# Patient Record
Sex: Female | Born: 1985 | Race: Black or African American | Hispanic: No | Marital: Single | Smoking: Former smoker
Health system: Southern US, Community
[De-identification: ages and names within clinical notes are randomized; demographics above are authoritative.]

## PROBLEM LIST (undated history)

## (undated) ENCOUNTER — Inpatient Hospital Stay (HOSPITAL_COMMUNITY): Payer: Self-pay

## (undated) DIAGNOSIS — M419 Scoliosis, unspecified: Secondary | ICD-10-CM

## (undated) HISTORY — PX: WISDOM TOOTH EXTRACTION: SHX21

## (undated) HISTORY — DX: Scoliosis, unspecified: M41.9

## (undated) HISTORY — PX: OTHER SURGICAL HISTORY: SHX169

---

## 2007-03-07 ENCOUNTER — Emergency Department (HOSPITAL_COMMUNITY): Admission: EM | Admit: 2007-03-07 | Discharge: 2007-03-07 | Payer: Self-pay | Admitting: Emergency Medicine

## 2009-03-26 ENCOUNTER — Ambulatory Visit (HOSPITAL_COMMUNITY): Admission: RE | Admit: 2009-03-26 | Discharge: 2009-03-26 | Payer: Self-pay | Admitting: Obstetrics

## 2009-04-19 ENCOUNTER — Ambulatory Visit (HOSPITAL_COMMUNITY): Admission: RE | Admit: 2009-04-19 | Discharge: 2009-04-19 | Payer: Self-pay | Admitting: Obstetrics & Gynecology

## 2009-08-20 ENCOUNTER — Ambulatory Visit (HOSPITAL_COMMUNITY): Admission: RE | Admit: 2009-08-20 | Discharge: 2009-08-20 | Payer: Self-pay | Admitting: Obstetrics & Gynecology

## 2009-08-31 ENCOUNTER — Inpatient Hospital Stay (HOSPITAL_COMMUNITY): Admission: AD | Admit: 2009-08-31 | Discharge: 2009-09-04 | Payer: Self-pay | Admitting: Obstetrics & Gynecology

## 2010-04-27 LAB — URINALYSIS, DIPSTICK ONLY
Leukocytes, UA: NEGATIVE
Nitrite: NEGATIVE
Protein, ur: NEGATIVE mg/dL
Specific Gravity, Urine: 1.015 (ref 1.005–1.030)
Urobilinogen, UA: 0.2 mg/dL (ref 0.0–1.0)

## 2010-04-27 LAB — CBC
HCT: 34.2 % — ABNORMAL LOW (ref 36.0–46.0)
HCT: 36.5 % (ref 36.0–46.0)
HCT: 37.3 % (ref 36.0–46.0)
Hemoglobin: 12.3 g/dL (ref 12.0–15.0)
MCH: 29.3 pg (ref 26.0–34.0)
MCH: 29.5 pg (ref 26.0–34.0)
MCH: 29.6 pg (ref 26.0–34.0)
MCHC: 33.1 g/dL (ref 30.0–36.0)
MCV: 89 fL (ref 78.0–100.0)
MCV: 89.2 fL (ref 78.0–100.0)
Platelets: 215 10*3/uL (ref 150–400)
Platelets: 227 10*3/uL (ref 150–400)
RBC: 4.09 MIL/uL (ref 3.87–5.11)
RDW: 14.3 % (ref 11.5–15.5)
RDW: 14.4 % (ref 11.5–15.5)
RDW: 14.5 % (ref 11.5–15.5)
WBC: 11 10*3/uL — ABNORMAL HIGH (ref 4.0–10.5)
WBC: 21.1 10*3/uL — ABNORMAL HIGH (ref 4.0–10.5)
WBC: 8.6 10*3/uL (ref 4.0–10.5)

## 2010-04-27 LAB — RPR: RPR Ser Ql: NONREACTIVE

## 2010-04-27 LAB — COMPREHENSIVE METABOLIC PANEL
ALT: 31 U/L (ref 0–35)
Albumin: 3 g/dL — ABNORMAL LOW (ref 3.5–5.2)
Alkaline Phosphatase: 185 U/L — ABNORMAL HIGH (ref 39–117)
GFR calc Af Amer: >60 mL/min (ref >60)
Potassium: 4 mEq/L (ref 3.5–5.1)
Total Bilirubin: 0.3 mg/dL (ref 0.3–1.2)
Total Protein: 6.6 g/dL (ref 6.0–8.3)

## 2010-08-27 IMAGING — US US OB DETAIL+14 WK
1 of 2 series · 14 of 28 positions shown · non-contrast
Comparison: none

OBSTETRICAL ULTRASOUND:
 This ultrasound exam was performed in the [HOSPITAL] Ultrasound Department.  The OB US report was generated in the AS system, and faxed to the ordering physician.  This report is also available in [HOSPITAL]?s AccessANYware and in [REDACTED] PACS.

[Series 1: us ob detail +14 wk · 0.30mm/px · 50 acquisitions, 14 frames shown]
[im 1/50]
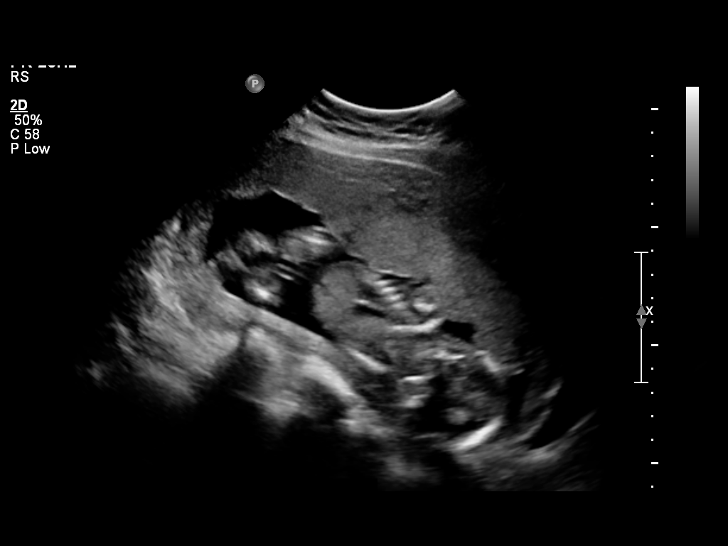
[im 4/50]
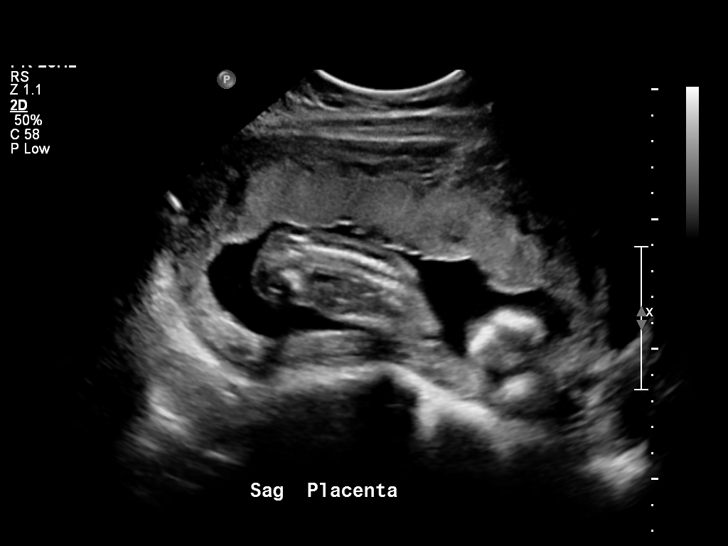
[im 8/50]
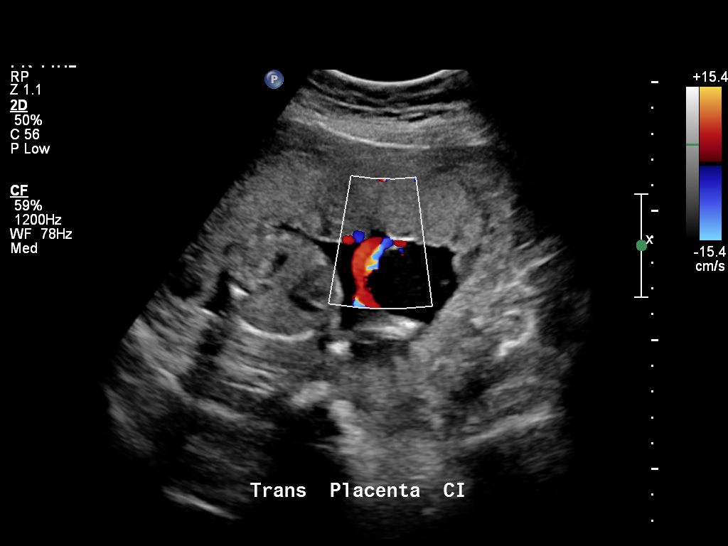
[im 12/50]
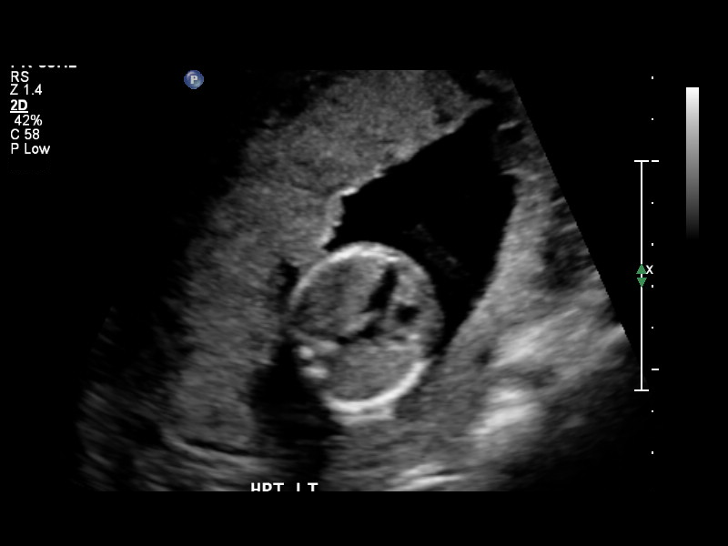
[im 16/50]
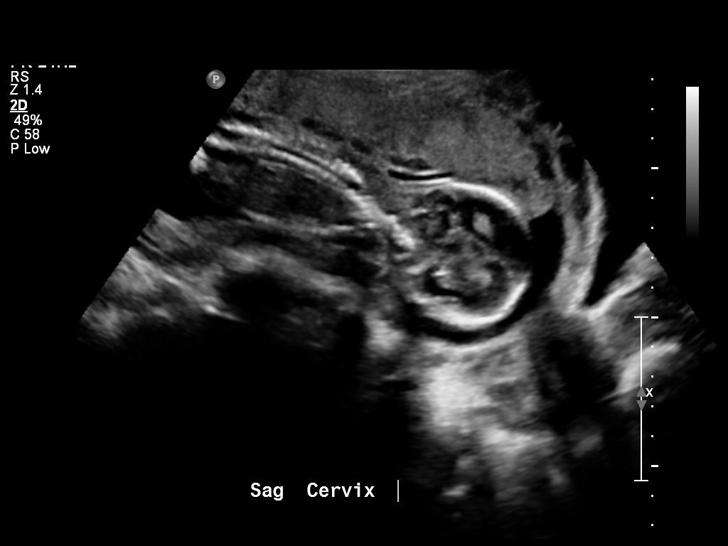
[im 19/50]
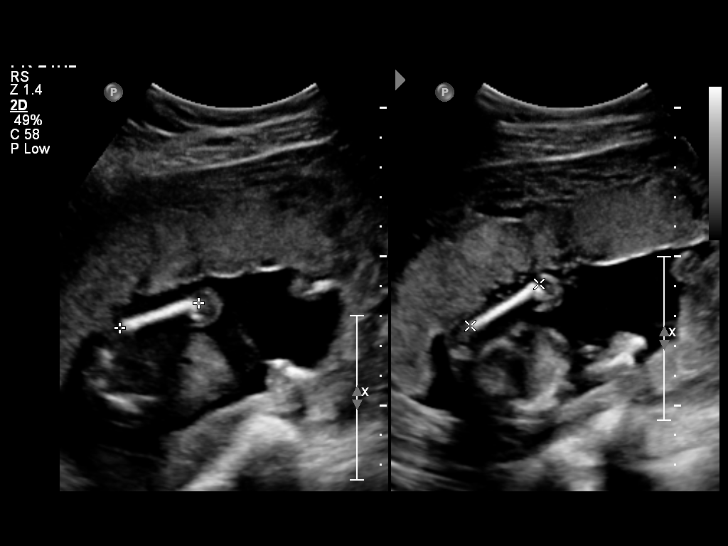
[im 23/50]
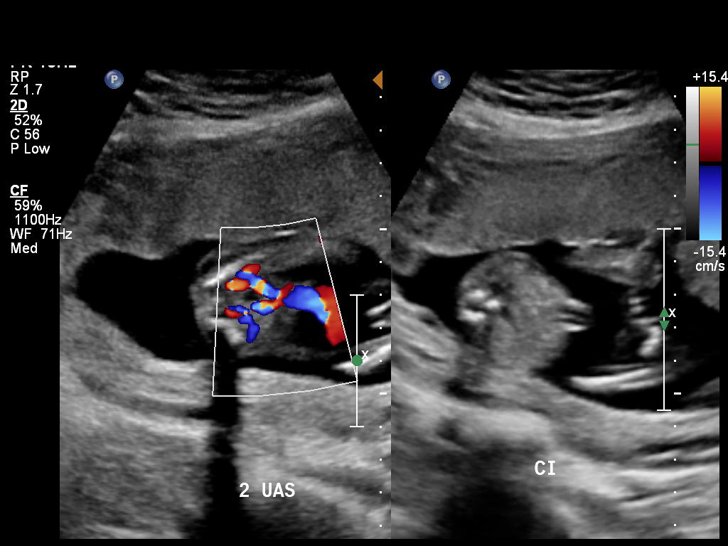
[im 27/50]
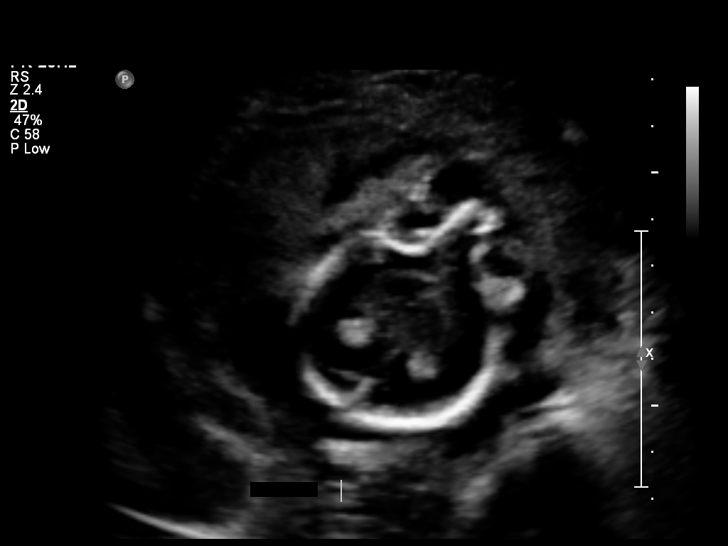
[im 31/50]
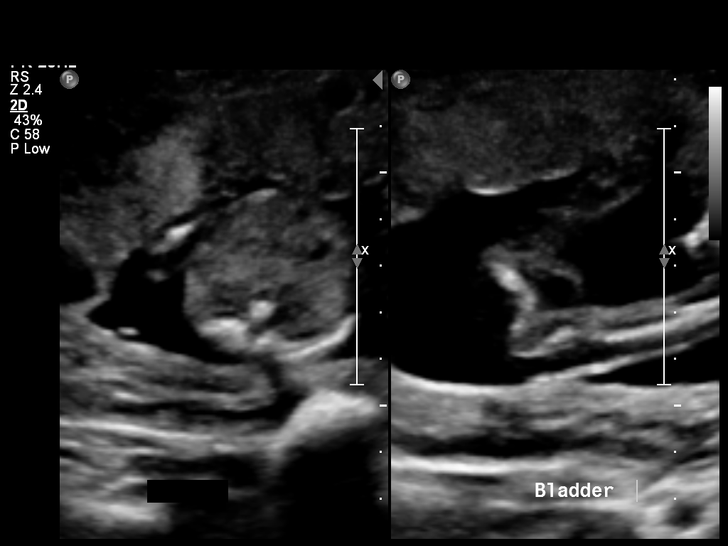
[im 34/50]
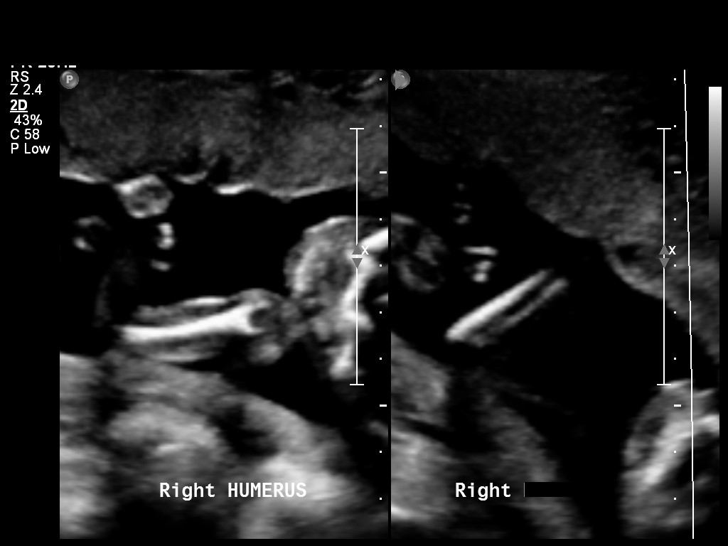
[im 38/50]
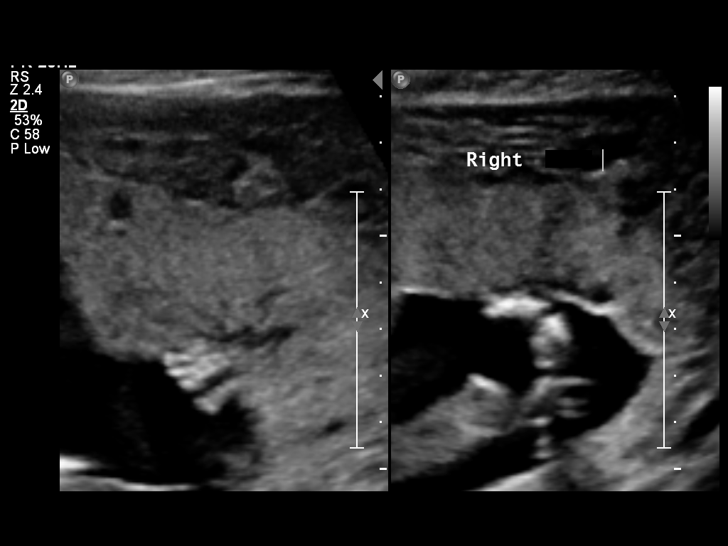
[im 42/50]
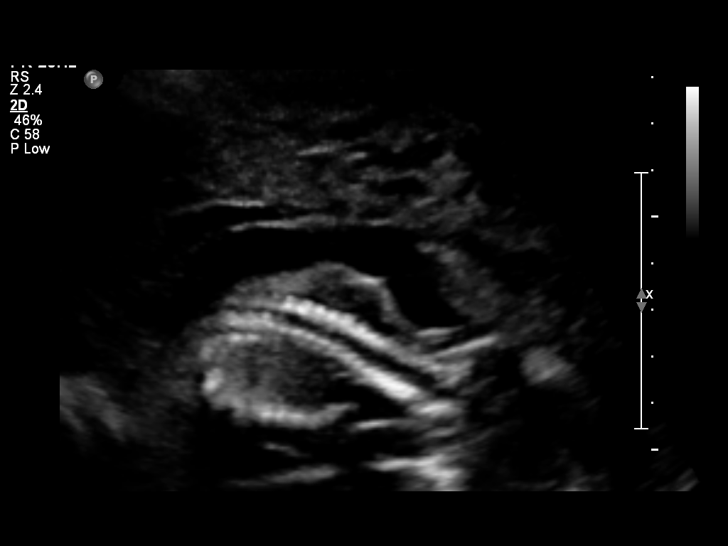
[im 46/50]
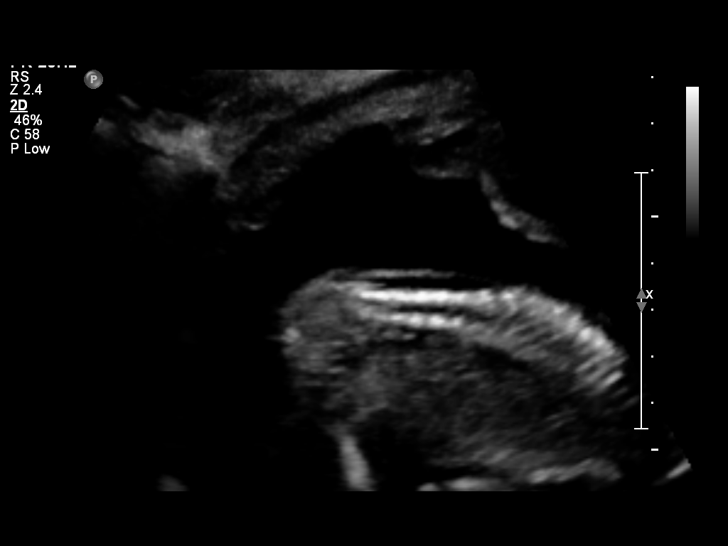
[im 50/50]
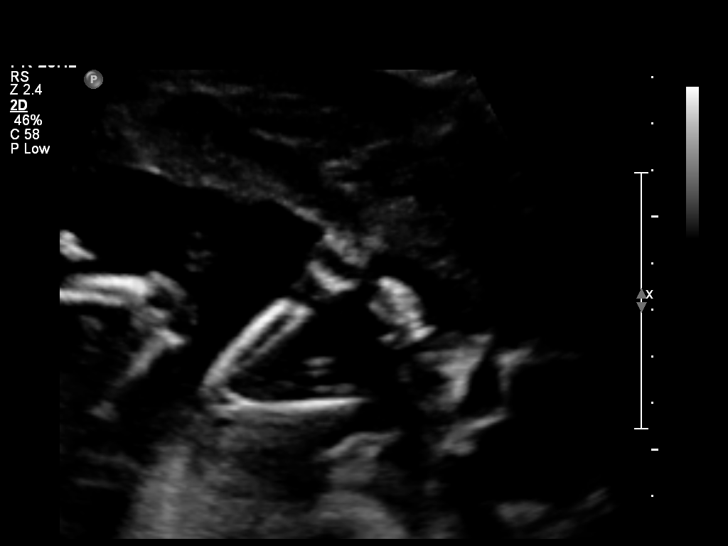

[14 of 28 positions shown; findings below may reference images not displayed]

IMPRESSION: See AS Obstetric US report.

## 2012-06-09 ENCOUNTER — Ambulatory Visit (INDEPENDENT_AMBULATORY_CARE_PROVIDER_SITE_OTHER): Payer: Medicaid Other | Admitting: Obstetrics

## 2012-06-09 ENCOUNTER — Encounter: Payer: Self-pay | Admitting: Obstetrics

## 2012-06-09 VITALS — BP 130/93 | HR 75 | Temp 97.8°F | Wt 166.0 lb

## 2012-06-09 DIAGNOSIS — Z113 Encounter for screening for infections with a predominantly sexual mode of transmission: Secondary | ICD-10-CM

## 2012-06-09 DIAGNOSIS — N760 Acute vaginitis: Secondary | ICD-10-CM | POA: Insufficient documentation

## 2012-06-09 MED ORDER — TINIDAZOLE 500 MG PO TABS: 1000.0000 mg | ORAL_TABLET | Freq: Every day | ORAL | Status: DC

## 2012-06-09 NOTE — Progress Notes (Signed)
Subjective:     Veronica Hall is a 27 y.o. female here for problem visit.  Current complaints: yellow vaginal discharge with odor x 3 weeks.  Personal health questionnaire reviewed: yes.   Gynecologic History Patient's last menstrual period was 05/24/2012. Contraception: OCP (estrogen/progesterone)  The following portions of the patient's history were reviewed and updated as appropriate: allergies, current medications, past family history, past medical history, past social history, past surgical history and problem list.  Review of Systems Pertinent items are noted in HPI.    Objective:    Pelvic: cervix normal in appearance, external genitalia normal and Vagina with gray, thin discharge    Assessment:    BV  Plan:    Education reviewed: safe sex/STD prevention.   Tindamax Rx

## 2012-06-10 ENCOUNTER — Encounter: Payer: Self-pay | Admitting: Obstetrics

## 2012-06-10 LAB — WET PREP BY MOLECULAR PROBE
Candida species: NEGATIVE
Gardnerella vaginalis: POSITIVE — AB
Trichomonas vaginosis: NEGATIVE

## 2012-06-10 NOTE — Patient Instructions (Signed)
+  BV

## 2012-07-22 ENCOUNTER — Encounter: Payer: Self-pay | Admitting: Obstetrics & Gynecology

## 2012-07-26 ENCOUNTER — Encounter: Payer: Self-pay | Admitting: Obstetrics

## 2012-07-26 ENCOUNTER — Ambulatory Visit: Payer: Self-pay | Admitting: Obstetrics & Gynecology

## 2012-10-15 ENCOUNTER — Encounter (HOSPITAL_COMMUNITY): Payer: Self-pay

## 2012-10-15 ENCOUNTER — Inpatient Hospital Stay (HOSPITAL_COMMUNITY): Payer: Medicaid Other

## 2012-10-15 ENCOUNTER — Inpatient Hospital Stay (HOSPITAL_COMMUNITY)
Admission: AD | Admit: 2012-10-15 | Discharge: 2012-10-15 | Disposition: A | Discharge status: Home / Self Care | Payer: Medicaid Other | Source: Ambulatory Visit | Attending: Obstetrics & Gynecology | Admitting: Obstetrics & Gynecology

## 2012-10-15 DIAGNOSIS — O4690 Antepartum hemorrhage, unspecified, unspecified trimester: Secondary | ICD-10-CM

## 2012-10-15 DIAGNOSIS — O071 Delayed or excessive hemorrhage following failed attempted termination of pregnancy: Secondary | ICD-10-CM | POA: Insufficient documentation

## 2012-10-15 LAB — CBC
HCT: 38.4 % (ref 36.0–46.0)
Hemoglobin: 12.6 g/dL (ref 12.0–15.0)
MCV: 86.1 fL (ref 78.0–100.0)
RDW: 13.7 % (ref 11.5–15.5)

## 2012-10-15 LAB — ABO/RH: ABO/RH(D): A POS

## 2012-10-15 NOTE — MAU Provider Note (Signed)
History     CSN: [id number]  Arrival date and time: 10/15/12 1007   None     Chief Complaint  Patient presents with  . Vaginal Bleeding   HPI Comments: Veronica Hall 27 y.o. [id number] presents to MAU for vaginal bleeding that is minimal. She is pregnant with unknown GA. She had TAB in July and did not keep her follow up appointment with clinic. She started to attempt pregnancy after one month  From TAB. She is a patient of Dr Tamela Oddi.     Patient is a 27 y.o. female presenting with vaginal bleeding.  Vaginal Bleeding      Past Medical History  Diagnosis Date  . Scoliosis     Past Surgical History  Procedure Laterality Date  . Spinal infusion    . Wisdom tooth extraction      Family History  Problem Relation Age of Onset  . Hypertension    . Hyperlipidemia    . Parkinson's disease Maternal Grandmother   . Alzheimer's disease Maternal Grandmother   . Hyperlipidemia Maternal Grandfather     History  Substance Use Topics  . Smoking status: Former Smoker    Types: Cigarettes    Quit date: 06/09/2008  . Smokeless tobacco: Never Used  . Alcohol Use: Yes     Comment: "socially"    Allergies: No Known Allergies  Prescriptions prior to admission  Medication Sig Dispense Refill  . naproxen sodium (ANAPROX) 220 MG tablet Take 220 mg by mouth 2 (two) times daily with a meal.      . penicillin v potassium (VEETID) 500 MG tablet Take 500 mg by mouth 4 (four) times daily.        Review of Systems  Constitutional: Negative.   HENT: Negative.   Respiratory: Negative.   Cardiovascular: Negative.   Gastrointestinal: Negative.   Genitourinary: Negative.        Bleeding vaginally  Musculoskeletal: Negative.   Skin: Negative.   Neurological: Negative.   Psychiatric/Behavioral: Negative.        Tearful   Physical Exam   Blood pressure 147/96, pulse 86, temperature 98.2 F (36.8 C), temperature source Oral, resp. rate 16, height 5\' 4"  (1.626 m), weight 166  lb 12.8 oz (75.66 kg), last menstrual period 07/19/2012, SpO2 100.00%.  Physical Exam  Constitutional: She is oriented to person, place, and time. She appears well-developed and well-nourished. No distress.  HENT:  Head: Normocephalic and atraumatic.  GI: Soft. Bowel sounds are normal. She exhibits distension. There is no tenderness. There is no rebound and no guarding.  Genitourinary:  Minimal amount of vaginal bleeding, os closed  Neurological: She is alert and oriented to person, place, and time.  Skin: Skin is warm.  Psychiatric:  tearful   Results for orders placed during the hospital encounter of 10/15/12 (from the past 24 hour(s))  POCT PREGNANCY, URINE     Status: Abnormal   Collection Time    10/15/12 10:47 AM      Result Value Range   Preg Test, Ur POSITIVE (*) NEGATIVE  CBC     Status: None   Collection Time    10/15/12 11:06 AM      Result Value Range   WBC 4.8  4.0 - 10.5 K/uL   RBC 4.46  3.87 - 5.11 MIL/uL   Hemoglobin 12.6  12.0 - 15.0 g/dL   HCT 30.8  65.7 - 84.6 %   MCV 86.1  78.0 - 100.0 fL   MCH 28.3  26.0 - 34.0 pg   MCHC 32.8  30.0 - 36.0 g/dL   RDW 40.9  81.1 - 91.4 %   Platelets 268  150 - 400 K/uL  ABO/RH     Status: None   Collection Time    10/15/12 11:06 AM      Result Value Range   ABO/RH(D) A POS    HCG, QUANTITATIVE, PREGNANCY     Status: Abnormal   Collection Time    10/15/12 11:06 AM      Result Value Range   hCG, Beta Chain, Quant, S 1309 (*) <5 mIU/mL   US Ob Comp Less 14 Wks  10/15/2012   *RADIOLOGY REPORT*  Clinical Data: Positive pregnancy test with vaginal bleeding.  Post T A B July 2015  OBSTETRIC <14 WK [place] AND TRANSVAGINAL OB US  Technique:  Both transabdominal and transvaginal ultrasound examinations were performed for complete evaluation of the gestation as well as the maternal uterus, adnexal regions, and pelvic cul-de-sac.  Transvaginal technique was performed to assess early pregnancy.  Comparison:  None.  Intrauterine  gestational sac:  Not seen Yolk sac: Not seen Embryo: Not seen Cardiac Activity: Not applicable Heart Rate: Not applicable bpm  Maternal uterus/adnexae: There is an area of focal fundal endometrial thickening identified measuring 1.6 by 2.5 x 1.8 cm which demonstrates marked intralesional flow from the posterior myometrium with color Doppler assessment.  No pelvic fluid or separate adnexal masses are seen.  Both ovaries have a normal appearance with the right ovary measuring 2.1 x 3.9 x 1.3 cm and the left ovary measuring 2.7 x 2.8 x 1.2 cm.  IMPRESSION: Area of focal heterogeneous endometrial thickening with associated intralesional flow.  Given the history of recent TAB in July 2014 and positive serum pregnancy test this would be concerning for the presence of retained products of conception.  The patient reported the TAB was performed at a gestational age of [redacted] weeks and the amount of tissue would be prominent for retained products associated with this early gestational age. Because of this history, an alternative consideration should be given to this representing a new molar gestation.  Normal ovaries,  This report was called to MAU.   Original Report Authenticated By: Rhodia Albright, M.D.     MAU Course  Procedures  MDM CBC, U/S, BHCG, ABORh Spoke with Dr Tamela Oddi who will review ultrasound in radiology and determine her plan of care. After reviewing ultrasound Dr Edmonia Caprio advised patient to go back to [facility] to follow up with Clinic that did TAB ( she has an appointment Sept 12th ). If needed she may return to Dr Edmonia Caprio office next week  Assessment and Plan  A: Vaginal bleeding related to retained products of conception  P: Return to Clinic where TAB was preformed Follow up with Dr Jean RosenthalChristell Constant as needed Return to MAU if symptoms worsen between these appointments  Veronica Hall, Veronica Hall 10/15/2012, 1:13 PM

## 2012-10-15 NOTE — MAU Note (Signed)
Patient states she had termination in [place] on 7-19 with medication. States she had a little bleeding after. States she had not had a period and did a positive pregnancy test and it was positive. Had spotting yesterday and states heavier bleeding today. Denies pain.

## 2012-10-22 ENCOUNTER — Encounter: Payer: Self-pay | Admitting: Advanced Practice Midwife

## 2012-10-22 ENCOUNTER — Ambulatory Visit (INDEPENDENT_AMBULATORY_CARE_PROVIDER_SITE_OTHER): Payer: Medicaid Other | Admitting: Advanced Practice Midwife

## 2012-10-22 VITALS — BP 139/94 | Temp 99.3°F | Wt 168.6 lb

## 2012-10-22 DIAGNOSIS — N760 Acute vaginitis: Secondary | ICD-10-CM

## 2012-10-22 DIAGNOSIS — Z3481 Encounter for supervision of other normal pregnancy, first trimester: Secondary | ICD-10-CM

## 2012-10-22 DIAGNOSIS — A499 Bacterial infection, unspecified: Secondary | ICD-10-CM

## 2012-10-22 DIAGNOSIS — R030 Elevated blood-pressure reading, without diagnosis of hypertension: Secondary | ICD-10-CM

## 2012-10-22 DIAGNOSIS — Z3480 Encounter for supervision of other normal pregnancy, unspecified trimester: Secondary | ICD-10-CM

## 2012-10-22 DIAGNOSIS — IMO0001 Reserved for inherently not codable concepts without codable children: Secondary | ICD-10-CM

## 2012-10-22 DIAGNOSIS — B9689 Other specified bacterial agents as the cause of diseases classified elsewhere: Secondary | ICD-10-CM

## 2012-10-22 LAB — POCT URINALYSIS DIPSTICK
Glucose, UA: NEGATIVE
Ketones, UA: NEGATIVE
Nitrite, UA: NEGATIVE
Protein, UA: NEGATIVE
Spec Grav, UA: 1.015
pH, UA: 7

## 2012-10-22 NOTE — Progress Notes (Signed)
P- 74 Pt states she is having a pinkish discharge with an odor.   Subjective:    Veronica Hall is being seen today for her first obstetrical visit.  This is not a planned pregnancy. She is at [redacted]w[redacted]d gestation. Her obstetrical history is significant for pregnancy induced hypertension. Relationship with FOB: engaged, living with significant other. Patient does intend to breast feed. Pregnancy history fully reviewed.  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 She has a daughter and step son. Everything is well at home.   Menstrual History: OB History   Grav Para Term Preterm Abortions TAB SAB Ect Mult Living   3 1 1  0 1 1 0 0 0 1      Last Pap: 2013 Results were normal Menarche age: 38 Regular  Patient's last menstrual period was 08/29/2012.    The following portions of the patient's history were reviewed and updated as appropriate: allergies, current medications, past family history, past medical history, past social history, past surgical history and problem list.  Review of Systems A comprehensive review of systems was negative.    Objective:    BP 139/94  Temp(Src) 99.3 F (37.4 C)  Wt 168 lb 9.6 oz (76.476 kg)  BMI 28.93 kg/m2  LMP 08/29/2012  General Appearance:    Alert, cooperative, no distress, appears stated age  Head:    Normocephalic, without obvious abnormality, atraumatic  Eyes:    PERRL, conjunctiva/corneas clear, EOM's intact, fundi    benign, both eyes  Ears:    Normal TM's and external ear canals, both ears  Nose:   Nares normal, septum midline, mucosa normal, no drainage    or sinus tenderness  Throat:   Lips, mucosa, and tongue normal; teeth and gums normal  Neck:   Supple, symmetrical, trachea midline, no adenopathy;    thyroid:  no enlargement/tenderness/nodules; no carotid   bruit or JVD  Back:     Symmetric, no curvature, Hall normal, no CVA tenderness  Lungs:     Clear to auscultation bilaterally,  respirations unlabored  Chest Wall:    No tenderness or deformity   Heart:    Regular rate and rhythm, S1 and S2 normal, no murmur, rub   or gallop  Breast Exam:    No tenderness, masses, or nipple abnormality  Abdomen:     Soft, non-tender, bowel sounds active all four quadrants,    no masses, no organomegaly  Genitalia:    Normal female without lesion, discharge or tenderness  Rectal:    Normal tone, normal prostate, no masses or tenderness;   guaiac negative stool  Extremities:   Extremities normal, atraumatic, no cyanosis or edema  Pulses:   2+ and symmetric all extremities  Skin:   Skin color, texture, turgor normal, no rashes or lesions  Lymph nodes:   Cervical, supraclavicular, and axillary nodes normal  Neurologic:   CNII-XII intact, normal strength, sensation and reflexes    throughout      Assessment:    Pregnancy at [redacted]w[redacted]d weeks  Patient Active Problem List   Diagnosis Date Noted  . Elevated BP 10/22/2012  . Supervision of other normal pregnancy 10/22/2012  . BV (bacterial vaginosis) 10/22/2012      Plan:    Initial labs drawn. Prenatal vitamins.  Counseling provided regarding continued use of seat belts, cessation of alcohol consumption, smoking or use of illicit drugs; infection precautions i.e., influenza/TDAP immunizations, toxoplasmosis,CMV, parvovirus, listeria and varicella; workplace  safety, exercise during pregnancy; routine dental care, safe medications, sexual activity, hot tubs, saunas, pools, travel, caffeine use, fish and methlymercury, potential toxins, hair treatments, varicose veins Weight gain recommendations reviewed: underweight/BMI< 18.5--> gain 28 - 40 lbs; normal weight/BMI 18.5 - 24.9--> gain 25 - 35 lbs; overweight/BMI 25 - 29.9--> gain 15 - 25 lbs; obese/BMI >30->gain  11 - 20 lbs Problem list reviewed and updated. AFP3 discussed: plan after 14 weeks. Role of ultrasound in pregnancy discussed; fetal survey: requested. Amniocentesis discussed:  not indicated. Follow up in 4 weeks. Patient to [facility] early next week for a formal US to establish viability and dating.  Metrogel. Continue to monitor BP, consider labs if it continues to be elevated. Patient very anxious today about pregnancy.   80% of 40 min visit spent on counseling and coordination of care.

## 2012-10-23 LAB — OBSTETRIC PANEL
Antibody Screen: NEGATIVE
Basophils Absolute: 0 10*3/uL (ref 0.0–0.1)
Basophils Relative: 1 % (ref 0–1)
Eosinophils Absolute: 0.1 10*3/uL (ref 0.0–0.7)
HCT: 38.7 % (ref 36.0–46.0)
Hemoglobin: 12.8 g/dL (ref 12.0–15.0)
Hepatitis B Surface Ag: NEGATIVE
Lymphocytes Relative: 40 % (ref 12–46)
MCH: 28.3 pg (ref 26.0–34.0)
MCHC: 33.1 g/dL (ref 30.0–36.0)
Monocytes Relative: 6 % (ref 3–12)
RBC: 4.52 MIL/uL (ref 3.87–5.11)
Rubella: 3.56 Index — ABNORMAL HIGH (ref <0.90)
WBC: 3.6 10*3/uL — ABNORMAL LOW (ref 4.0–10.5)

## 2012-10-23 LAB — CULTURE, OB URINE: Colony Count: NO GROWTH

## 2012-10-23 LAB — VITAMIN D 25 HYDROXY (VIT D DEFICIENCY, FRACTURES): Vit D, 25-Hydroxy: 51 ng/mL (ref 30–89)

## 2012-10-25 ENCOUNTER — Other Ambulatory Visit: Payer: Self-pay | Admitting: *Deleted

## 2012-10-25 DIAGNOSIS — Z3480 Encounter for supervision of other normal pregnancy, unspecified trimester: Secondary | ICD-10-CM

## 2012-10-26 LAB — HEMOGLOBINOPATHY EVALUATION
Hemoglobin Other: 0 %
Hgb A: 97.1 % (ref 96.8–97.8)
Hgb S Quant: 0 %

## 2012-10-26 LAB — PAP IG W/ RFLX HPV ASCU

## 2012-10-26 MED ORDER — METRONIDAZOLE 0.75 % VA GEL: 1.0000 | Freq: Every day | VAGINAL | Status: DC

## 2012-10-26 NOTE — Addendum Note (Signed)
Addended byWilson Singer, Clarissa Laird H on: 10/26/2012 12:15 PM   Modules accepted: Orders

## 2012-10-27 ENCOUNTER — Other Ambulatory Visit: Payer: Self-pay | Admitting: Obstetrics & Gynecology

## 2012-10-27 ENCOUNTER — Ambulatory Visit (HOSPITAL_COMMUNITY)
Admission: RE | Admit: 2012-10-27 | Discharge: 2012-10-27 | Disposition: A | Discharge status: Home / Self Care | Payer: Medicaid Other | Source: Ambulatory Visit | Attending: Obstetrics & Gynecology | Admitting: Obstetrics & Gynecology

## 2012-10-27 ENCOUNTER — Other Ambulatory Visit: Payer: Medicaid Other

## 2012-10-27 DIAGNOSIS — Z3480 Encounter for supervision of other normal pregnancy, unspecified trimester: Secondary | ICD-10-CM

## 2012-10-27 LAB — GC/CHLAMYDIA PROBE AMP
CT Probe RNA: NEGATIVE
GC Probe RNA: NEGATIVE

## 2012-11-01 ENCOUNTER — Ambulatory Visit (INDEPENDENT_AMBULATORY_CARE_PROVIDER_SITE_OTHER): Payer: Medicaid Other | Admitting: Obstetrics & Gynecology

## 2012-11-01 VITALS — BP 144/87 | Temp 98.3°F | Wt 176.2 lb

## 2012-11-01 DIAGNOSIS — Z3481 Encounter for supervision of other normal pregnancy, first trimester: Secondary | ICD-10-CM

## 2012-11-01 DIAGNOSIS — Z3480 Encounter for supervision of other normal pregnancy, unspecified trimester: Secondary | ICD-10-CM

## 2012-11-01 DIAGNOSIS — O200 Threatened abortion: Secondary | ICD-10-CM

## 2012-11-01 LAB — POCT URINALYSIS DIPSTICK
Bilirubin, UA: NEGATIVE
Blood, UA: NEGATIVE
Glucose, UA: NEGATIVE
Nitrite, UA: NEGATIVE

## 2012-11-01 NOTE — Progress Notes (Signed)
Pulse- 80 Cardiac activity on U/S.

## 2012-11-02 LAB — PROGESTERONE: Progesterone: 2.8 ng/mL

## 2012-11-03 NOTE — Patient Instructions (Signed)
Threatened Miscarriage  Bleeding during the first 20 weeks of pregnancy is common. This is sometimes called a threatened miscarriage. This is a pregnancy that is threatening to end before the twentieth week of pregnancy. Often this bleeding stops with bed rest or decreased activities as suggested by your caregiver and the pregnancy continues without any more problems. You may be asked to not have sexual intercourse, have orgasms or use tampons until further notice. Sometimes a threatened miscarriage can progress to a complete or incomplete miscarriage. This may or may not require further treatment. Some miscarriages occur before a woman misses a menstrual period and knows she is pregnant.  Miscarriages occur in 15 to 20% of all pregnancies and usually occur during the first 13 weeks of the pregnancy. The exact cause of a miscarriage is usually never known. A miscarriage is natures way of ending a pregnancy that is abnormal or would not make it to term. There are some things that may put you at risk to have a miscarriage, such as:  · Hormone problems.  · Infection of the uterus or cervix.  · Chronic illness, diabetes for example, especially if it is not controlled.  · Abnormal shaped uterus.  · Fibroids in the uterus.  · Incompetent cervix (the cervix is too weak to hold the baby).  · Smoking.  · Drinking too much alcohol. It's best not to drink any alcohol when you are pregnant.  · Taking illegal drugs.  TREATMENT   When a miscarriage becomes complete and all products of conception (all the tissue in the uterus) have been passed, often no treatment is needed. If you think you passed tissue, save it in a container and take it to your doctor for evaluation. If the miscarriage is incomplete (parts of the fetus or placenta remain in the uterus), further treatment may be needed. The most common reason for further treatment is continued bleeding (hemorrhage) because pregnancy tissue did not pass out of the uterus. This  often occurs if a miscarriage is incomplete. Tissue left behind may also become infected. Treatment usually is dilatation and curettage (the removal of the remaining products of pregnancy. This can be done by a simple sucking procedure (suction curettage) or a simple scraping of the inside of the uterus. This may be done in the hospital or in the caregiver's office. This is only done when your caregiver knows that there is no chance for the pregnancy to proceed to term. This is determined by physical examination, negative pregnancy test, falling pregnancy hormone count and/or, an ultrasound revealing a dead fetus.  Miscarriages are often a very emotional time for prospective mothers and fathers. This is not you or your partners fault. It did not occur because of an inadequacy in you or your partner. Nearly all miscarriages occur because the pregnancy has started off wrongly. At least half of these pregnancies have a chromosomal abnormality. It is almost always not inherited. Others may have developmental problems with the fetus or placenta. This does not always show up even when the products miscarried are studied under the microscope. The miscarriage is nearly always not your fault and it is not likely that you could have prevented it from happening. If you are having emotional and grieving problems, talk to your health care provider and even seek counseling, if necessary, before getting pregnant again. You can begin trying for another pregnancy as soon as your caregiver says it is OK.  HOME CARE INSTRUCTIONS   · Your caregiver may order   bed rest depending on how much bleeding and cramping you are having. You may be limited to only getting up to go to the bathroom. You may be allowed to continue light activity. You may need to make arrangements for the care of your other children and for any other responsibilities.  · Keep track of the number of pads you use each day, how often you have to change pads and how  saturated (soaked) they are. Record this information.  · DO NOT USE TAMPONS. Do not douche, have sexual intercourse or orgasms until approved by your caregiver.  · You may receive a follow up appointment for re-evaluation of your pregnancy and a repeat blood test. Re-evaluation often occurs after 2 days and again in 4 to 6 weeks. It is very important that you follow-up in the recommended time period.  · If you are Rh negative and the father is Rh positive or you do not know the fathers' blood type, you may receive a shot (Rh immune globulin) to help prevent abnormal antibodies that can develop and affect the baby in any future pregnancies.  SEEK IMMEDIATE MEDICAL CARE IF:  · You have severe cramps in your stomach, back, or abdomen.  · You have a sudden onset of severe pain in the lower part of your abdomen.  · You develop chills.  · You run an unexplained temperature of 101° F (38.3° C) or higher.  · You pass large clots or tissue. Save any tissue for your caregiver to inspect.  · Your bleeding increases or you become light-headed, weak, or have fainting episodes.  · You have a gush of fluid from your vagina.  · You pass out. This could mean you have a tubal (ectopic) pregnancy.  Document Released: 01/27/2005 Document Revised: 04/21/2011 Document Reviewed: 09/13/2007  ExitCare® Patient Information ©2014 [employer].

## 2012-11-05 ENCOUNTER — Encounter: Payer: Self-pay | Admitting: *Deleted

## 2012-11-05 ENCOUNTER — Encounter: Payer: Self-pay | Admitting: Obstetrics & Gynecology

## 2012-11-08 ENCOUNTER — Other Ambulatory Visit: Payer: Self-pay | Admitting: *Deleted

## 2012-11-08 DIAGNOSIS — O200 Threatened abortion: Secondary | ICD-10-CM

## 2012-11-10 ENCOUNTER — Other Ambulatory Visit: Payer: Self-pay | Admitting: Obstetrics & Gynecology

## 2012-11-10 ENCOUNTER — Ambulatory Visit (HOSPITAL_COMMUNITY)
Admission: RE | Admit: 2012-11-10 | Discharge: 2012-11-10 | Disposition: A | Discharge status: Home / Self Care | Payer: Medicaid Other | Source: Ambulatory Visit | Attending: Obstetrics & Gynecology | Admitting: Obstetrics & Gynecology

## 2012-11-10 DIAGNOSIS — O200 Threatened abortion: Secondary | ICD-10-CM

## 2012-11-11 ENCOUNTER — Encounter: Payer: Self-pay | Admitting: Obstetrics & Gynecology

## 2012-11-11 ENCOUNTER — Other Ambulatory Visit: Payer: Self-pay

## 2012-11-12 ENCOUNTER — Other Ambulatory Visit: Payer: Medicaid Other

## 2012-11-12 ENCOUNTER — Other Ambulatory Visit: Payer: Self-pay

## 2012-11-12 DIAGNOSIS — O200 Threatened abortion: Secondary | ICD-10-CM

## 2012-11-13 LAB — HCG, QUANTITATIVE, PREGNANCY: hCG, Beta Chain, Quant, S: 15.5 m[IU]/mL

## 2012-11-17 ENCOUNTER — Ambulatory Visit (INDEPENDENT_AMBULATORY_CARE_PROVIDER_SITE_OTHER): Payer: Medicaid Other | Admitting: Obstetrics & Gynecology

## 2012-11-17 ENCOUNTER — Encounter: Payer: Self-pay | Admitting: Obstetrics & Gynecology

## 2012-11-17 VITALS — BP 144/85 | HR 68 | Temp 99.0°F | Wt 173.0 lb

## 2012-11-17 DIAGNOSIS — O039 Complete or unspecified spontaneous abortion without complication: Secondary | ICD-10-CM

## 2012-11-17 MED ORDER — NORETHIN-ETH ESTRAD-FE BIPHAS 1 MG-10 MCG / 10 MCG PO TABS: 1.0000 | ORAL_TABLET | Freq: Every day | ORAL | Status: DC

## 2012-11-17 NOTE — Patient Instructions (Signed)
Miscarriage A miscarriage is the sudden loss of an unborn baby (fetus) before the 20th week of pregnancy. Most miscarriages happen in the first 3 months of pregnancy. Sometimes, it happens before a woman even knows she is pregnant. A miscarriage is also called a "spontaneous miscarriage" or "early pregnancy loss." Having a miscarriage can be an emotional experience. Talk with your caregiver about any questions you may have about miscarrying, the grieving process, and your future pregnancy plans. CAUSES   Problems with the fetal chromosomes that make it impossible for the baby to develop normally. Problems with the baby's genes or chromosomes are most often the result of errors that occur, by chance, as the embryo divides and grows. The problems are not inherited from the parents.  Infection of the cervix or uterus.   Hormone problems.   Problems with the cervix, such as having an incompetent cervix. This is when the tissue in the cervix is not strong enough to hold the pregnancy.   Problems with the uterus, such as an abnormally shaped uterus, uterine fibroids, or congenital abnormalities.   Certain medical conditions.   Smoking, drinking alcohol, or taking illegal drugs.   Trauma.  Often, the cause of a miscarriage is unknown.  SYMPTOMS   Vaginal bleeding or spotting, with or without cramps or pain.  Pain or cramping in the abdomen or lower back.  Passing fluid, tissue, or blood clots from the vagina. DIAGNOSIS  Your caregiver will perform a physical exam. You may also have an ultrasound to confirm the miscarriage. Blood or urine tests may also be ordered. TREATMENT   Sometimes, treatment is not necessary if you naturally pass all the fetal tissue that was in the uterus. If some of the fetus or placenta remains in the body (incomplete miscarriage), tissue left behind may become infected and must be removed. Usually, a dilation and curettage (D and C) procedure is performed.  During a D and C procedure, the cervix is widened (dilated) and any remaining fetal or placental tissue is gently removed from the uterus.  Antibiotic medicines are prescribed if there is an infection. Other medicines may be given to reduce the size of the uterus (contract) if there is a lot of bleeding.  If you have Rh negative blood and your baby was Rh positive, you will need a Rh immunoglobulin shot. This shot will protect any future baby from having Rh blood problems in future pregnancies. HOME CARE INSTRUCTIONS   Your caregiver may order bed rest or may allow you to continue light activity. Resume activity as directed by your caregiver.  Have someone help with home and family responsibilities during this time.   Keep track of the number of sanitary pads you use each day and how soaked (saturated) they are. Write down this information.   Do not use tampons. Do not douche or have sexual intercourse until approved by your caregiver.   Only take over-the-counter or prescription medicines for pain or discomfort as directed by your caregiver.   Do not take aspirin. Aspirin can cause bleeding.   Keep all follow-up appointments with your caregiver.   If you or your partner have problems with grieving, talk to your caregiver or seek counseling to help cope with the pregnancy loss. Allow enough time to grieve before trying to get pregnant again.  SEEK IMMEDIATE MEDICAL CARE IF:   You have severe cramps or pain in your back or abdomen.  You have a fever.  You pass large blood clots (walnut-sized   or larger) ortissue from your vagina. Save any tissue for your caregiver to inspect.   Your bleeding increases.   You have a thick, bad-smelling vaginal discharge.  You become lightheaded, weak, or you faint.   You have chills.  MAKE SURE YOU:  Understand these instructions.  Will watch your condition.  Will get help right away if you are not doing well or get  worse. Document Released: 07/23/2000 Document Revised: 07/29/2011 Document Reviewed: 03/18/2011 ExitCare Patient Information 2014 [employer].  

## 2012-11-17 NOTE — Progress Notes (Signed)
Subjective:     Veronica Hall is a 27 y.o. female here for a routine exam.  Current complaints: Patient is in the office today for follow up to [place] and hospital visit. Patient reports she has had some pinkish type bleeding- light since Thursday. Patient reports no cramping. .  Personal health questionnaire reviewed: no.   Gynecologic History Patient's last menstrual period was 08/29/2012. Contraception: none  Obstetric History OB History  Gravida Para Term Preterm AB SAB TAB Ectopic Multiple Living  3 1 1  0 1 0 1 0 0 1    # Outcome Date GA Lbr Len/2nd Weight Sex Delivery Anes PTL Lv  3 CUR           2 TRM 09/02/09     SVD   Y  1 TAB                The following portions of the patient's history were reviewed and updated as appropriate: allergies, current medications, past family history, past medical history, past social history, past surgical history and problem list.  Review of Systems Pertinent items are noted in HPI.    Objective:     No exam today     Assessment:    Early pregnancy failure  Plan:    Management options reviewed--she elects to proceed w/expectant management F/U in 1 mth

## 2012-11-18 ENCOUNTER — Encounter: Payer: Medicaid Other | Admitting: Obstetrics & Gynecology

## 2012-11-20 NOTE — Progress Notes (Signed)
This encounter was created in error - please disregard.

## 2012-12-18 ENCOUNTER — Other Ambulatory Visit: Payer: Self-pay | Admitting: Advanced Practice Midwife

## 2013-03-24 ENCOUNTER — Other Ambulatory Visit: Payer: Self-pay | Admitting: Family Medicine

## 2013-03-24 DIAGNOSIS — R221 Localized swelling, mass and lump, neck: Secondary | ICD-10-CM

## 2013-03-25 ENCOUNTER — Ambulatory Visit
Admission: RE | Admit: 2013-03-25 | Discharge: 2013-03-25 | Disposition: A | Discharge status: Home / Self Care | Payer: BC Managed Care – PPO | Source: Ambulatory Visit | Attending: Family Medicine | Admitting: Family Medicine

## 2013-03-25 DIAGNOSIS — R221 Localized swelling, mass and lump, neck: Secondary | ICD-10-CM

## 2013-04-21 ENCOUNTER — Ambulatory Visit (INDEPENDENT_AMBULATORY_CARE_PROVIDER_SITE_OTHER): Payer: BC Managed Care – PPO | Admitting: Obstetrics & Gynecology

## 2013-04-21 ENCOUNTER — Encounter: Payer: Self-pay | Admitting: Obstetrics & Gynecology

## 2013-04-21 VITALS — BP 163/101 | HR 70 | Temp 99.0°F | Ht 64.0 in | Wt 171.0 lb

## 2013-04-21 DIAGNOSIS — R102 Pelvic and perineal pain: Secondary | ICD-10-CM

## 2013-04-21 DIAGNOSIS — N949 Unspecified condition associated with female genital organs and menstrual cycle: Secondary | ICD-10-CM

## 2013-04-21 DIAGNOSIS — N760 Acute vaginitis: Secondary | ICD-10-CM

## 2013-04-21 DIAGNOSIS — Z793 Long term (current) use of hormonal contraceptives: Principal | ICD-10-CM

## 2013-04-21 DIAGNOSIS — N912 Amenorrhea, unspecified: Secondary | ICD-10-CM

## 2013-04-21 LAB — POCT URINE PREGNANCY: PREG TEST UR: NEGATIVE

## 2013-04-21 MED ORDER — FLUCONAZOLE 150 MG PO TABS: 150.0000 mg | ORAL_TABLET | ORAL | Status: AC

## 2013-04-21 NOTE — Progress Notes (Signed)
Subjective:     Veronica Hall is a 28 y.o. female here for a routine exam.  Current complaints: Patient is in the office today because she is having yeast infection every month- usually around the time she would normally have a cycle. She also c/o abdominal pain that started Monday. Patient is concerned about acne..  Personal health questionnaire reviewed: yes.  Patient did have an exam at her primary care doctor about a month ago and was told her glucose levels were elevated. CBG 77 today Gynecologic History Patient's last menstrual period was 08/29/2012. Contraception: OCP (estrogen/progesterone) Last Pap: 10/2012. Results were: normal  Obstetric History OB History  Gravida Para Term Preterm AB SAB TAB Ectopic Multiple Living  3 1 1  0 1 0 1 0 0 1    # Outcome Date GA Lbr Len/2nd Weight Sex Delivery Anes PTL Lv  3 TRM 09/02/09     SVD   Y  2 GRA              Comments: System Generated. Please review and update pregnancy details.  1 TAB                The following portions of the patient's history were reviewed and updated as appropriate: allergies, current medications, past family history, past medical history, past social history, past surgical history and problem list.  Review of Systems Pertinent items are noted in HPI.    Objective:     SPEC: thick, white discharge     Assessment:    Recurrent, candida vulvovaginitis Plan:      Orders Placed This Encounter  Procedures  . GC/Chlamydia Probe Amp  . Urine culture  . WET PREP BY MOLECULAR PROBE  . Urinalysis, Routine w reflex microscopic  . POCT urine pregnancy  . POCT Wet Prep [facility] [place])  Treat current infection-->suppressive Rx Return in a few months

## 2013-04-22 LAB — URINALYSIS, ROUTINE W REFLEX MICROSCOPIC
Bilirubin Urine: NEGATIVE
Glucose, UA: NEGATIVE mg/dL
Hgb urine dipstick: NEGATIVE
LEUKOCYTES UA: NEGATIVE
NITRITE: NEGATIVE
PH: 7 (ref 5.0–8.0)
Protein, ur: NEGATIVE mg/dL
SPECIFIC GRAVITY, URINE: 1.024 (ref 1.005–1.030)
UROBILINOGEN UA: 1 mg/dL (ref 0.0–1.0)

## 2013-04-22 LAB — WET PREP BY MOLECULAR PROBE
CANDIDA SPECIES: NEGATIVE
Gardnerella vaginalis: POSITIVE — AB
Trichomonas vaginosis: NEGATIVE

## 2013-04-22 LAB — GC/CHLAMYDIA PROBE AMP
CT PROBE, AMP APTIMA: NEGATIVE
GC PROBE AMP APTIMA: NEGATIVE

## 2013-04-23 LAB — URINE CULTURE

## 2013-04-29 LAB — POCT WET PREP (WET MOUNT): Clue Cells Wet Prep Whiff POC: NEGATIVE

## 2013-04-29 NOTE — Patient Instructions (Addendum)
Candidal Vulvovaginitis  Candidal vulvovaginitis is an infection of the vagina and vulva. The vulva is the skin around the opening of the vagina. This may cause itching and discomfort in and around the vagina.   HOME CARE  · Only take medicine as told by your doctor.  · Do not have sex (intercourse) until the infection is healed or as told by your doctor.  · Practice safe sex.  · Tell your sex partner about your infection.  · Do not douche or use tampons.  · Wear cotton underwear. Do not wear tight pants or panty hose.  · Eat yogurt. This may help treat and prevent yeast infections.  GET HELP RIGHT AWAY IF:   · You have a fever.  · Your problems get worse during treatment or do not get better in 3 days.  · You have discomfort, irritation, or itching in your vagina or vulva area.  · You have pain after sex.  · You start to get belly (abdominal) pain.  MAKE SURE YOU:  · Understand these instructions.  · Will watch your condition.  · Will get help right away if you are not doing well or get worse.  Document Released: 04/25/2008 Document Revised: 04/21/2011 Document Reviewed: 04/25/2008  Exit[employer]® Patient Information ©2014 [employer].

## 2013-04-30 ENCOUNTER — Encounter: Payer: Self-pay | Admitting: Obstetrics & Gynecology

## 2013-04-30 DIAGNOSIS — O99820 Streptococcus B carrier state complicating pregnancy: Secondary | ICD-10-CM | POA: Insufficient documentation

## 2013-05-18 ENCOUNTER — Other Ambulatory Visit: Payer: Self-pay | Admitting: *Deleted

## 2013-05-18 DIAGNOSIS — Z3480 Encounter for supervision of other normal pregnancy, unspecified trimester: Secondary | ICD-10-CM

## 2013-05-18 MED ORDER — METRONIDAZOLE 500 MG PO TABS: 500.0000 mg | ORAL_TABLET | Freq: Two times a day (BID) | ORAL | Status: AC

## 2013-05-18 MED ORDER — PENICILLIN V POTASSIUM 500 MG PO TABS: 500.0000 mg | ORAL_TABLET | Freq: Four times a day (QID) | ORAL | Status: AC

## 2013-07-06 ENCOUNTER — Encounter (HOSPITAL_BASED_OUTPATIENT_CLINIC_OR_DEPARTMENT_OTHER): Payer: Self-pay | Admitting: Emergency Medicine

## 2013-07-06 ENCOUNTER — Emergency Department (HOSPITAL_BASED_OUTPATIENT_CLINIC_OR_DEPARTMENT_OTHER): Payer: Worker's Compensation

## 2013-07-06 ENCOUNTER — Emergency Department (HOSPITAL_BASED_OUTPATIENT_CLINIC_OR_DEPARTMENT_OTHER)
Admission: EM | Admit: 2013-07-06 | Discharge: 2013-07-06 | Disposition: A | Discharge status: Home / Self Care | Payer: Worker's Compensation | Attending: Emergency Medicine | Admitting: Emergency Medicine

## 2013-07-06 DIAGNOSIS — S8000XA Contusion of unspecified knee, initial encounter: Secondary | ICD-10-CM | POA: Insufficient documentation

## 2013-07-06 DIAGNOSIS — Z792 Long term (current) use of antibiotics: Secondary | ICD-10-CM | POA: Insufficient documentation

## 2013-07-06 DIAGNOSIS — Y939 Activity, unspecified: Secondary | ICD-10-CM | POA: Insufficient documentation

## 2013-07-06 DIAGNOSIS — Y929 Unspecified place or not applicable: Secondary | ICD-10-CM | POA: Insufficient documentation

## 2013-07-06 DIAGNOSIS — Z79899 Other long term (current) drug therapy: Secondary | ICD-10-CM | POA: Insufficient documentation

## 2013-07-06 DIAGNOSIS — Z8739 Personal history of other diseases of the musculoskeletal system and connective tissue: Secondary | ICD-10-CM | POA: Insufficient documentation

## 2013-07-06 DIAGNOSIS — Z87891 Personal history of nicotine dependence: Secondary | ICD-10-CM | POA: Insufficient documentation

## 2013-07-06 DIAGNOSIS — IMO0002 Reserved for concepts with insufficient information to code with codable children: Secondary | ICD-10-CM | POA: Insufficient documentation
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
## 2013-07-06 NOTE — Discharge Instructions (Signed)
Your knee xray was normal.  You likely have knee bruising. Please seek immediate care if you develop redness, increased swelling, instability, fevers, or chills. Otherwise, follow up with your primary doctor in 1-2 weeks if symptoms are not improving. You can rest, ice the knee, compress it with a knee sleeve, and elevate. Continue range of motion exercises but for a few days, avoid heavy lifting. You can use ibuprofen 4-600mg  three times daily with food as needed for pain.  Contusion A contusion is a deep bruise. Contusions happen when an injury causes bleeding under the skin. Signs of bruising include pain, puffiness (swelling), and discolored skin. The contusion may turn blue, purple, or yellow. HOME CARE   Put ice on the injured area.  Put ice in a plastic bag.  Place a towel between your skin and the bag.  Leave the ice on for 15-20 minutes, 03-04 times a day.  Only take medicine as told by your doctor.  Rest the injured area.  If possible, raise (elevate) the injured area to lessen puffiness. GET HELP RIGHT AWAY IF:   You have more bruising or puffiness.  You have pain that is getting worse.  Your puffiness or pain is not helped by medicine. MAKE SURE YOU:   Understand these instructions.  Will watch your condition.  Will get help right away if you are not doing well or get worse. Document Released: 07/16/2007 Document Revised: 04/21/2011 Document Reviewed: 12/02/2010 [facility] Patient Information 2014 [place], [place].
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
## 2013-07-21 ENCOUNTER — Ambulatory Visit: Payer: BC Managed Care – PPO | Admitting: Obstetrics & Gynecology

## 2013-09-18 ENCOUNTER — Other Ambulatory Visit: Payer: Self-pay | Admitting: Obstetrics & Gynecology

## 2013-12-12 ENCOUNTER — Encounter (HOSPITAL_BASED_OUTPATIENT_CLINIC_OR_DEPARTMENT_OTHER): Payer: Self-pay | Admitting: Emergency Medicine

## 2013-12-15 ENCOUNTER — Other Ambulatory Visit: Payer: Self-pay | Admitting: Obstetrics & Gynecology

## 2013-12-26 NOTE — Telephone Encounter (Signed)
Please advise 

## 2014-02-06 ENCOUNTER — Encounter: Payer: Self-pay | Admitting: *Deleted

## 2014-02-07 ENCOUNTER — Encounter: Payer: Self-pay | Admitting: Obstetrics & Gynecology

## 2014-03-20 IMAGING — US US TRANSVAGINAL NON-OB
1 series · 13 of 25 positions shown · non-contrast
Comparison: Pelvic ultrasound 10/27/2012; 10/15/2012.

CLINICAL DATA: Evaluate for retained products of conception, molar
pregnancy.

TRANSVAGINAL ULTRASOUND OF PELVIS
TECHNIQUE: Transvaginal ultrasound examination of the pelvis was
performed including evaluation of the uterus, ovaries, adnexal
regions, and pelvic cul-de-sac.

[Series 1: us pelvis complete · 13 of 63 slices shown]
[im 1/63]
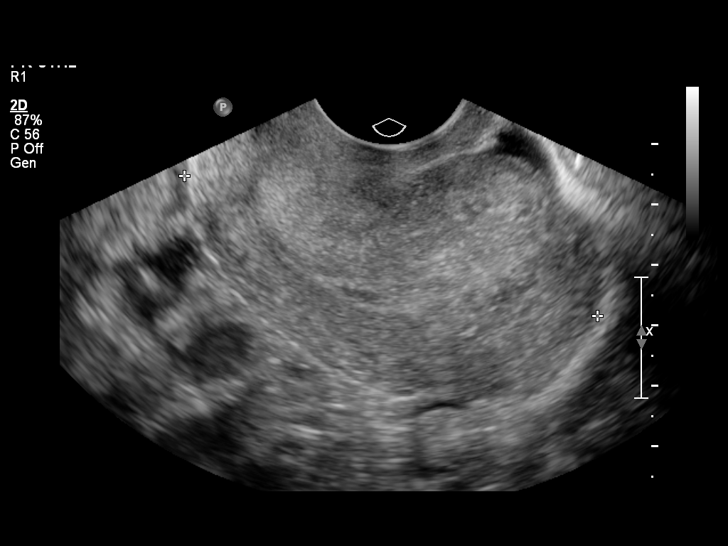
[im 6/63]
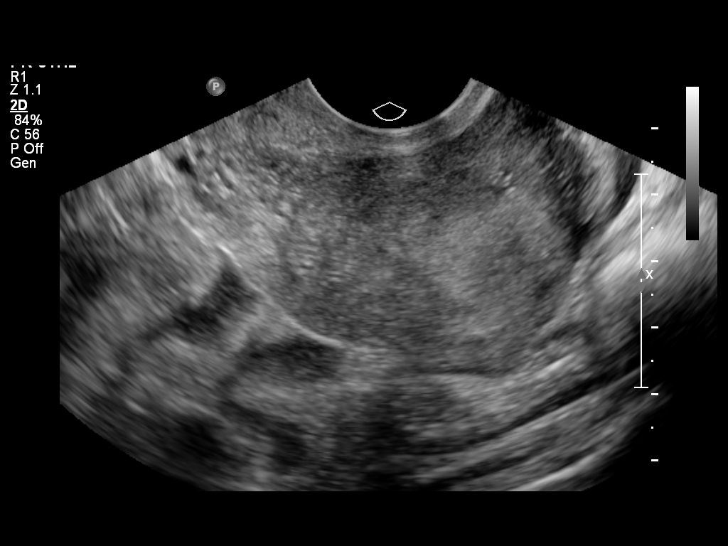
[im 11/63]
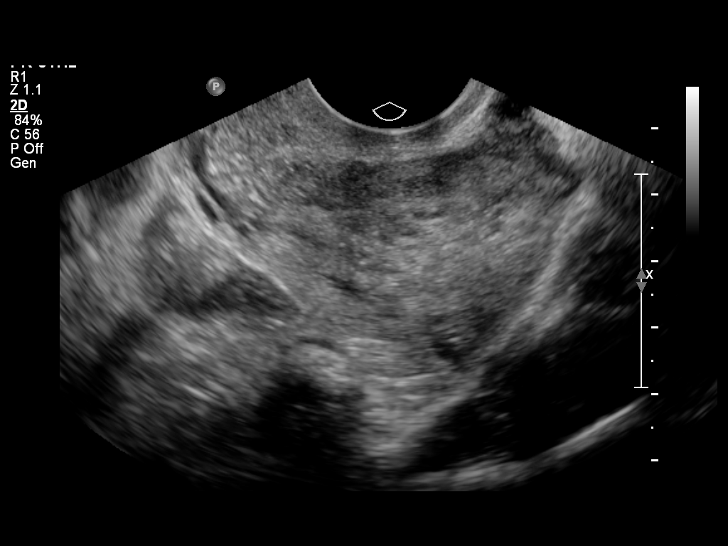
[im 16/63]
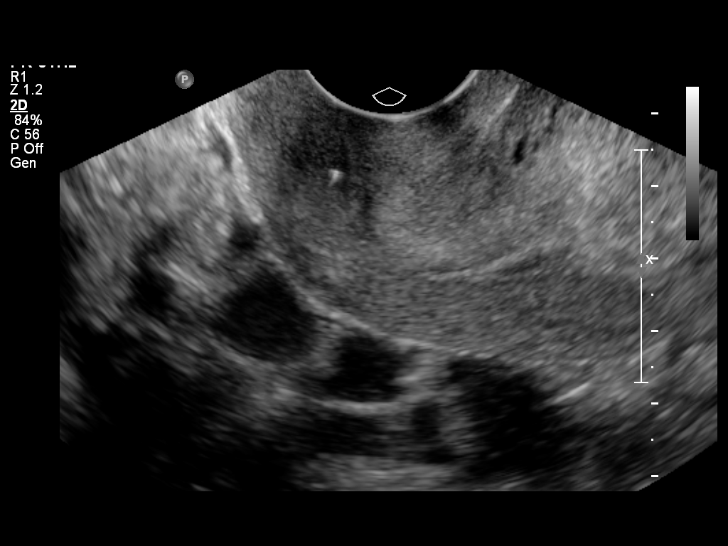
[im 21/63]
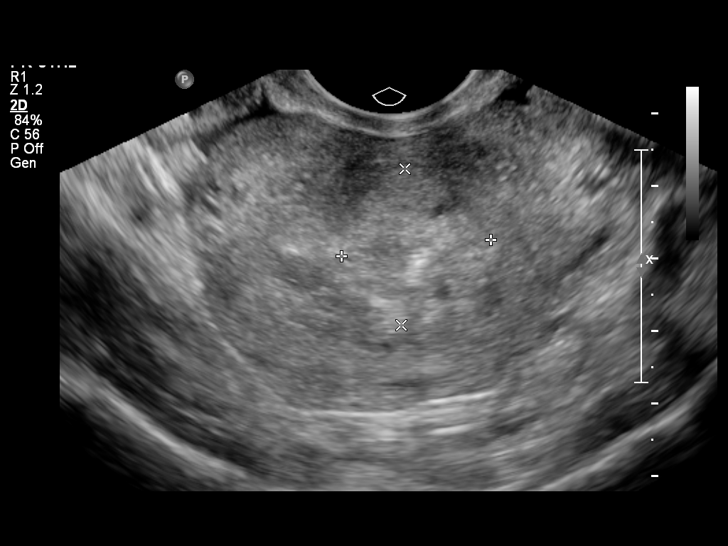
[im 26/63]
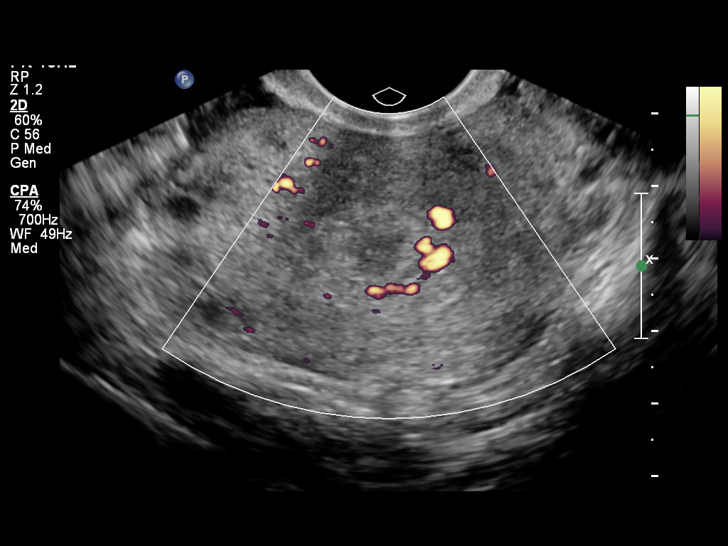
[im 32/63]
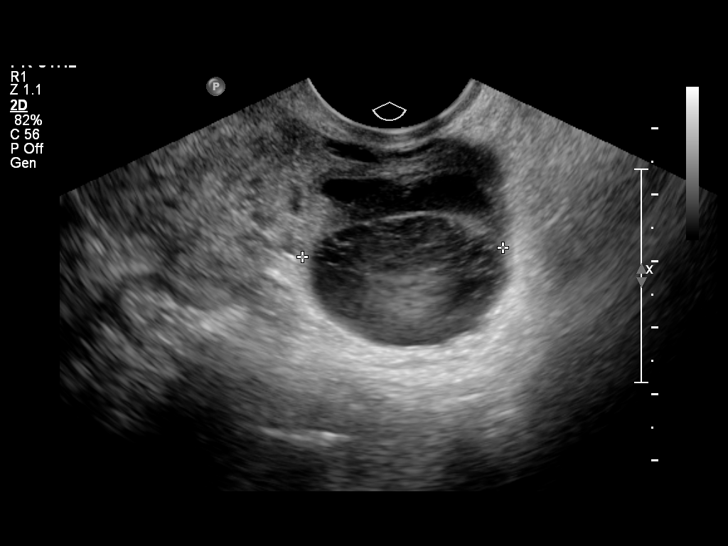
[im 37/63]
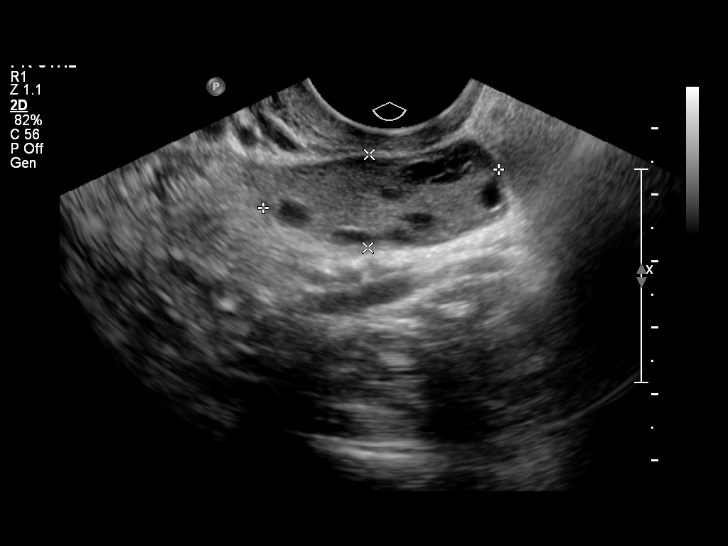
[im 42/63]
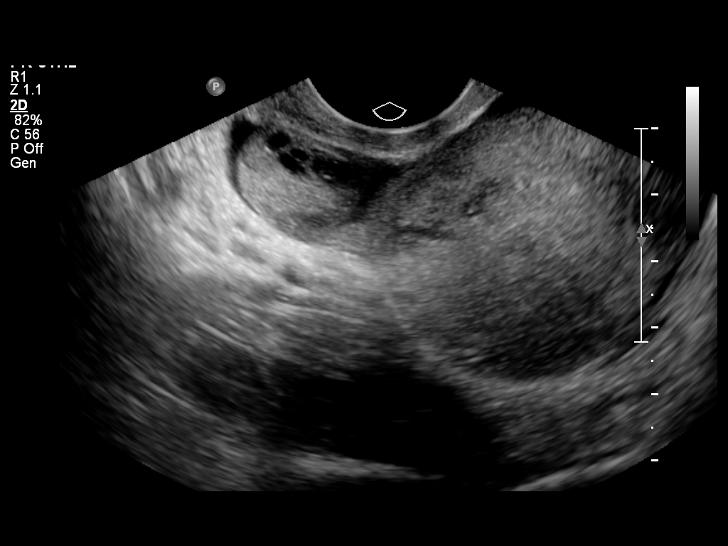
[im 47/63]
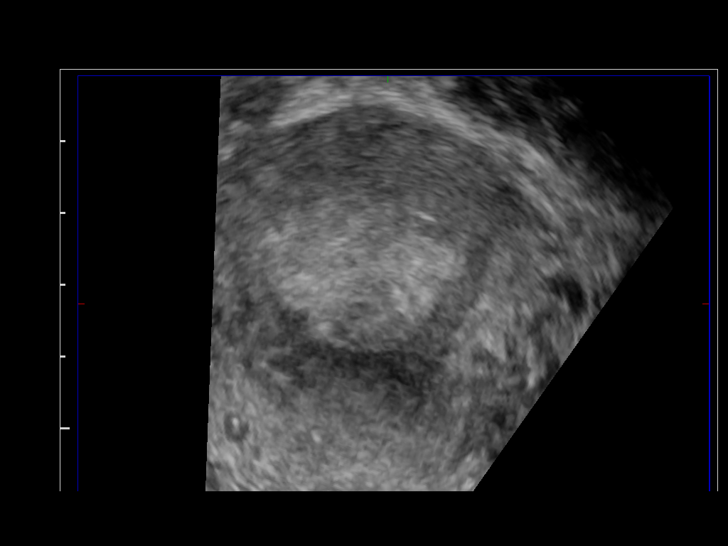
[im 52/63]
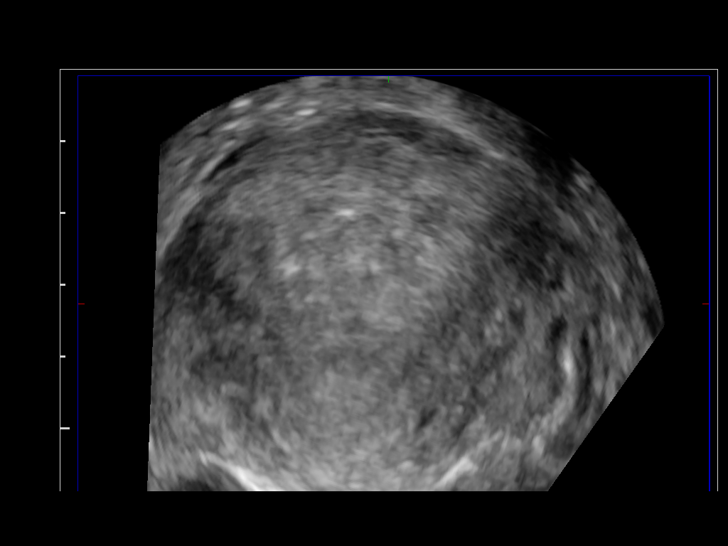
[im 57/63]
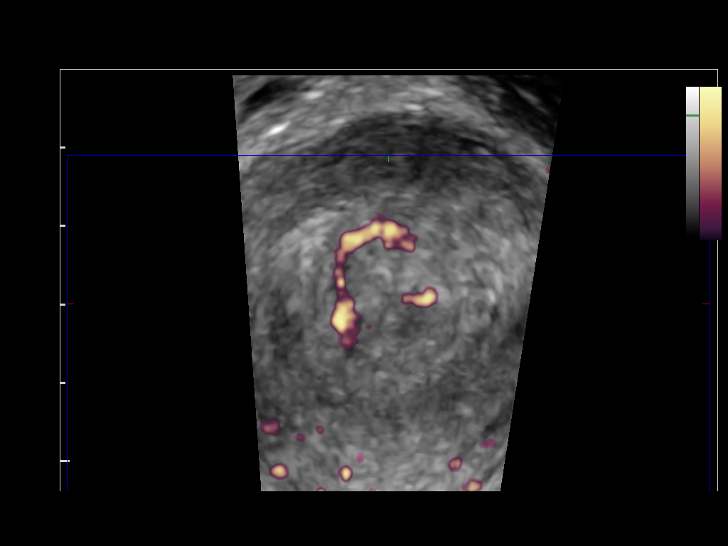
[im 63/63]
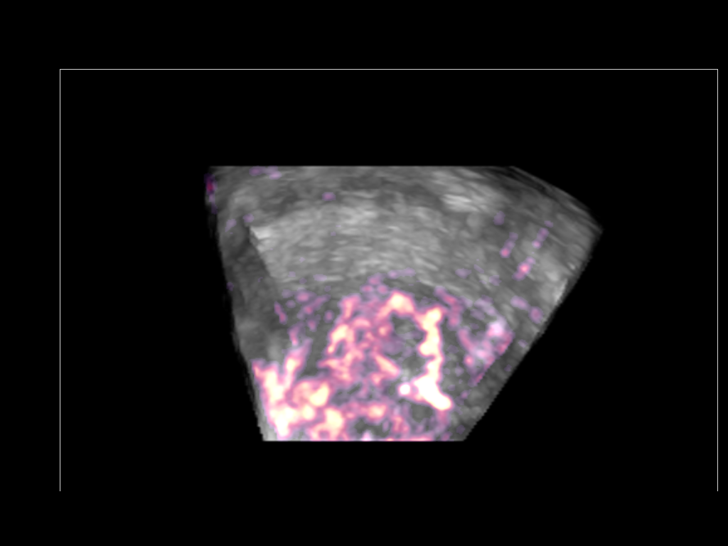

[13 of 25 positions shown; findings below may reference images not displayed]

FINDINGS: Uterus:  The uterus is retroverted.  It measures 7.2 x 4.0 x
cm.

Endometrium: The endometrium is thickened and contains a
heterogeneous mass like region measuring 2.1 x 1.7 x 2.1 cm.
Internal color blood flow is demonstrated in this area.

Right ovary: The right ovary is unremarkable and measures 3.6 x
x 1.4 cm.

Left ovary: The left ovary measures 4.1 x 3.1 x 3.0 cm.  There is a
3.4 x 2.6 x 3.0 cm probable hemorrhagic cyst within the left ovary.

Other Findings:  There is a moderate amount of minimally
complicated fluid within the pelvis.
IMPRESSION: 1.  Redemonstrated area of focal heterogeneous endometrial
thickening with associated internal blood flow.  Overall this
appears to have slightly increased in size when compared to recent
prior examination.  Differential considerations include retained
products of conception, potential molar pregnancy, or
pseudoaneurysm given the presence of prior instrumentation.

2. Probable hemorrhagic left ovarian cyst.  Attention on follow-up.

3.  Moderate amount of complicated pelvic free fluid.

## 2020-01-02 ENCOUNTER — Ambulatory Visit: Payer: BC Managed Care – PPO

## 2020-01-23 ENCOUNTER — Ambulatory Visit: Payer: BC Managed Care – PPO | Attending: Internal Medicine

## 2020-01-23 DIAGNOSIS — Z23 Encounter for immunization: Secondary | ICD-10-CM

## 2020-01-23 NOTE — Progress Notes (Signed)
   Covid-19 Vaccination Clinic  Name:  [name]    MRN: [id number] DOB: [birth year]  01/23/2020  [name] was observed post Covid-19 immunization for 15 minutes without incident. She was provided with Vaccine Information Sheet and instruction to access the V-Safe system.   [name] was instructed to call 911 with any severe reactions post vaccine: Marland Kitchen Difficulty breathing  . Swelling of face and throat  . A fast heartbeat  . A bad rash all over body  . Dizziness and weakness   Immunizations Administered    Name Date Dose VIS Date Route   Pfizer COVID-19 Vaccine 01/23/2020  3:08 PM 0.3 mL 11/30/2019 Intramuscular   Manufacturer: [employer], [employer]   Lot: [id number]   NDC: [id number]

## 2020-04-19 ENCOUNTER — Ambulatory Visit: Payer: Self-pay | Admitting: Surgery

## 2020-04-19 NOTE — H&P (Signed)
Surgical Evaluation  Chief Complaint: recurrent skin infections  HPI: This is a very pleasant 35 year old woman with a history of hypertension who has been experiencing intermittent boils in bilateral axilla for about 11 years, since the birth of her daughter.I first noted her about this in July 2020 and at that time she elected to pursue diet changes and topical therapy.  Since then she has continued to have issues. She has required it to be I&D'd multiple times, most recently with a boil that was about the size of an egg.  She is having flares now about twice a month. Lesions are in various locations on the axilla. She is interested in discussing options for treatment.  No Known Allergies  Past Medical History:  Diagnosis Date  . Scoliosis     Past Surgical History:  Procedure Laterality Date  . spinal infusion    . WISDOM TOOTH EXTRACTION      Family History  Problem Relation Age of Onset  . Hypertension Other   . Hyperlipidemia Other   . Parkinson's disease Maternal Grandmother   . Alzheimer's disease Maternal Grandmother   . Hyperlipidemia Maternal Grandfather     Social History   Socioeconomic History  . Marital status: Single    Spouse name: Not on file  . Number of children: 1  . Years of education: Not on file  . Highest education level: Not on file  Occupational History  . Occupation: Runner, broadcasting/film/video  . Occupation: Scientist, physiological  Tobacco Use  . Smoking status: Former Smoker    Types: Cigarettes    Quit date: 06/09/2008    Years since quitting: 11.8  . Smokeless tobacco: Never Used  Substance and Sexual Activity  . Alcohol use: No    Comment: "socially"  . Drug use: No  . Sexual activity: Yes    Partners: Male    Birth control/protection: OCP  Other Topics Concern  . Not on file  Social History Narrative  . Not on file   Social Determinants of Health   Financial Resource Strain: Not on file  Food Insecurity: Not on file  Transportation Needs: Not on  file  Physical Activity: Not on file  Stress: Not on file  Social Connections: Not on file    Current Outpatient Medications on File Prior to Visit  Medication Sig Dispense Refill  . Biotin 5000 MCG CAPS Take by mouth.    . fluconazole (DIFLUCAN) 150 MG tablet Take 1 tablet (150 mg total) by mouth every other day. x 3, then take once a week for 3 months 1 tablet 15  . LO LOESTRIN FE 1 MG-10 MCG / 10 MCG tablet TAKE 1 TABLET BY MOUTH DAILY. 28 tablet 11  . metroNIDAZOLE (FLAGYL) 500 MG tablet Take 1 tablet (500 mg total) by mouth 2 (two) times daily. 14 tablet 0  . penicillin v potassium (VEETID) 500 MG tablet Take 1 tablet (500 mg total) by mouth every 6 (six) hours. 28 tablet 0   No current facility-administered medications on file prior to visit.    Review of Systems: a complete, 10pt review of systems was completed with pertinent positives and negatives as documented in the HPI  Physical Exam: Vitals Weight: 173.25 lb Height: 64in Body Surface Area: 1.84 m Body Mass Index: 29.74 kg/m  Temp.: 97.53F  Pulse: 103 (Regular)  P.OX: 100% (Room air) BP: 118/76(Sitting, Left Arm, Standard)   Alert well-appearing, unlabored respirations In the left axilla there is an area of chronic inflammatory tissue along  the crease approximately 4 x 2 cm, no other active boils   CBC Latest Ref Rng & Units 10/22/2012 10/15/2012 09/03/2009  WBC 4.0 - 10.5 K/uL 3.6(L) 4.8 21.1(H)  Hemoglobin 12.0 - 15.0 g/dL 95.0 93.2 11.4(L)  Hematocrit 36.0 - 46.0 % 38.7 38.4 34.2(L)  Platelets 150 - 400 K/uL 278 268 215    CMP Latest Ref Rng & Units 08/31/2009  Glucose 70 - 99 mg/dL 91  BUN 6 - 23 mg/dL 6  Creatinine 0.4 - 1.2 mg/dL 6.71  Sodium 245 - 809 mEq/L 135  Potassium 3.5 - 5.1 mEq/L 4.0  Chloride 96 - 112 mEq/L 106  CO2 19 - 32 mEq/L 22  Calcium 8.4 - 10.5 mg/dL 9.4  Total Protein 6.0 - 8.3 g/dL 6.6  Total Bilirubin 0.3 - 1.2 mg/dL 0.3  Alkaline Phos 39 - 117 U/L 185(H)  AST 0 - 37  U/L 38(H)  ALT 0 - 35 U/L 31    No results found for: INR, PROTIME  Imaging: No results found.   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  We discussed the pathophysiology of this disease and treatment options including -brewers yeast free diet, -no shave, no chemical deodorants, keeping the area clean and dry, wear breathable loose clothing -topical treatments such as clindamycin/retinoin, -suppressive oral antibiotics, -systemic immunotherapy, -and finally surgery which ranges from local unroofing to wide excision with rotational flap coverage would be the only way to ensure no further flares, we discussed that this is a large surgery requiring several weeks of wound healing, with risk of bleeding, infection, pain, scarring and nearly 100% issue with wound healing.  She has had no improvement with nonsurgical therapy and has continued to have recurrent infections most prominently in the left axilla, now with flares about twice a month. I recommend local excision of the most active area in the left axillary crease. We discussed the procedure and risks of bleeding, infection, pain, scarring, wound healing problems, recurrent disease in other locations. Questions were answered. She wishes to proceed.    Patient Active Problem List   Diagnosis Date Noted  . GBS (group B Streptococcus carrier), +RV culture, currently pregnant 04/30/2013  . Elevated BP 10/22/2012  . Supervision of other normal pregnancy 10/22/2012  . BV (bacterial vaginosis) 10/22/2012       Phylliss Blakes, MD [facility]  See AMION to contact appropriate on-call provider

## 2020-11-27 ENCOUNTER — Emergency Department (HOSPITAL_BASED_OUTPATIENT_CLINIC_OR_DEPARTMENT_OTHER): Payer: BC Managed Care – PPO | Admitting: Radiology

## 2020-11-27 ENCOUNTER — Encounter (HOSPITAL_BASED_OUTPATIENT_CLINIC_OR_DEPARTMENT_OTHER): Payer: Self-pay | Admitting: Emergency Medicine

## 2020-11-27 ENCOUNTER — Other Ambulatory Visit: Payer: Self-pay

## 2020-11-27 ENCOUNTER — Emergency Department (HOSPITAL_BASED_OUTPATIENT_CLINIC_OR_DEPARTMENT_OTHER)
Admission: EM | Admit: 2020-11-27 | Discharge: 2020-11-27 | Disposition: A | Discharge status: Home / Self Care | Payer: BC Managed Care – PPO | Attending: Emergency Medicine | Admitting: Emergency Medicine

## 2020-11-27 DIAGNOSIS — M62830 Muscle spasm of back: Secondary | ICD-10-CM | POA: Diagnosis not present

## 2020-11-27 DIAGNOSIS — Z87891 Personal history of nicotine dependence: Secondary | ICD-10-CM | POA: Diagnosis not present

## 2020-11-27 DIAGNOSIS — M545 Low back pain, unspecified: Secondary | ICD-10-CM | POA: Insufficient documentation

## 2020-11-27 DIAGNOSIS — Y92410 Unspecified street and highway as the place of occurrence of the external cause: Secondary | ICD-10-CM | POA: Insufficient documentation

## 2020-11-27 DIAGNOSIS — M62838 Other muscle spasm: Secondary | ICD-10-CM

## 2020-11-27 LAB — PREGNANCY, URINE: Preg Test, Ur: NEGATIVE

## 2020-11-27 MED ORDER — CYCLOBENZAPRINE HCL 10 MG PO TABS: 10.0000 mg | ORAL_TABLET | Freq: Two times a day (BID) | ORAL | 0 refills | Status: AC | PRN

## 2020-11-27 NOTE — Discharge Instructions (Addendum)
X-ray showed no acute injury.  Recommend 800 mg ibuprofen every 8 hours as needed.  Recommend 1000 mg of Tylenol every 6 hours as needed.  Take muscle relaxant as prescribed.  Do not mix with alcohol or drugs as this can be sedating.  Follow-up with your primary care doctor. 
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
## 2020-11-27 NOTE — ED Notes (Signed)
Patient verbalizes understanding of discharge instructions. Opportunity for questioning and answers were provided. Patient discharged from ED.  °

## 2020-11-27 NOTE — ED Notes (Signed)
Patient transported to X-ray 
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
## 2022-10-21 ENCOUNTER — Other Ambulatory Visit: Payer: Self-pay

## 2022-10-21 ENCOUNTER — Emergency Department (HOSPITAL_COMMUNITY)
Admission: EM | Admit: 2022-10-21 | Discharge: 2022-10-21 | Disposition: A | Discharge status: Home / Self Care | Payer: BC Managed Care – PPO

## 2022-10-21 ENCOUNTER — Encounter (HOSPITAL_COMMUNITY): Payer: Self-pay | Admitting: *Deleted

## 2022-10-21 DIAGNOSIS — I10 Essential (primary) hypertension: Secondary | ICD-10-CM | POA: Insufficient documentation

## 2022-10-21 DIAGNOSIS — R42 Dizziness and giddiness: Secondary | ICD-10-CM | POA: Diagnosis present

## 2022-10-21 DIAGNOSIS — Z79899 Other long term (current) drug therapy: Secondary | ICD-10-CM | POA: Insufficient documentation

## 2022-10-21 DIAGNOSIS — R112 Nausea with vomiting, unspecified: Secondary | ICD-10-CM | POA: Insufficient documentation

## 2022-10-21 DIAGNOSIS — Z1152 Encounter for screening for COVID-19: Secondary | ICD-10-CM | POA: Diagnosis not present

## 2022-10-21 LAB — URINALYSIS, ROUTINE W REFLEX MICROSCOPIC
Bilirubin Urine: NEGATIVE
Glucose, UA: NEGATIVE mg/dL
Hgb urine dipstick: NEGATIVE
Ketones, ur: 20 mg/dL — AB
Leukocytes,Ua: NEGATIVE
Nitrite: NEGATIVE
Protein, ur: NEGATIVE mg/dL
Specific Gravity, Urine: 1.012 (ref 1.005–1.030)
pH: 7 (ref 5.0–8.0)

## 2022-10-21 LAB — RESP PANEL BY RT-PCR (RSV, FLU A&B, COVID)  RVPGX2
Influenza A by PCR: NEGATIVE
Influenza B by PCR: NEGATIVE
Resp Syncytial Virus by PCR: NEGATIVE
SARS Coronavirus 2 by RT PCR: NEGATIVE

## 2022-10-21 LAB — COMPREHENSIVE METABOLIC PANEL
ALT: 22 U/L (ref 0–44)
AST: 22 U/L (ref 15–41)
Albumin: 4 g/dL (ref 3.5–5.0)
Alkaline Phosphatase: 85 U/L (ref 38–126)
Anion gap: 10 (ref 5–15)
BUN: 13 mg/dL (ref 6–20)
CO2: 23 mmol/L (ref 22–32)
Calcium: 9 mg/dL (ref 8.9–10.3)
Chloride: 103 mmol/L (ref 98–111)
Creatinine, Ser: 0.67 mg/dL (ref 0.44–1.00)
GFR, Estimated: >60 mL/min (ref >60)
Glucose, Bld: 101 mg/dL — ABNORMAL HIGH (ref 70–99)
Potassium: 3.3 mmol/L — ABNORMAL LOW (ref 3.5–5.1)
Sodium: 136 mmol/L (ref 135–145)
Total Bilirubin: 0.7 mg/dL (ref 0.3–1.2)
Total Protein: 8.8 g/dL — ABNORMAL HIGH (ref 6.5–8.1)

## 2022-10-21 LAB — CBC
HCT: 43.4 % (ref 36.0–46.0)
Hemoglobin: 13.7 g/dL (ref 12.0–15.0)
MCH: 28.1 pg (ref 26.0–34.0)
MCHC: 31.6 g/dL (ref 30.0–36.0)
MCV: 88.9 fL (ref 80.0–100.0)
Platelets: 275 10*3/uL (ref 150–400)
RBC: 4.88 MIL/uL (ref 3.87–5.11)
RDW: 14 % (ref 11.5–15.5)
WBC: 6.3 10*3/uL (ref 4.0–10.5)
nRBC: 0 % (ref 0.0–0.2)

## 2022-10-21 LAB — HCG, SERUM, QUALITATIVE: Preg, Serum: NEGATIVE

## 2022-10-21 LAB — CBG MONITORING, ED: Glucose-Capillary: 101 mg/dL — ABNORMAL HIGH (ref 70–99)

## 2022-10-21 LAB — LIPASE, BLOOD: Lipase: 24 U/L (ref 11–51)

## 2022-10-21 MED ORDER — ONDANSETRON HCL 4 MG PO TABS: 4.0000 mg | ORAL_TABLET | Freq: Four times a day (QID) | ORAL | 0 refills | Status: DC

## 2022-10-21 MED ORDER — ONDANSETRON HCL 4 MG/2ML IJ SOLN
4.0000 mg | Freq: Once | INTRAMUSCULAR | Status: AC
  Administered 2022-10-21: 4 mg via INTRAVENOUS
  Filled 2022-10-21: qty 2

## 2022-10-21 MED ORDER — MECLIZINE HCL 25 MG PO TABS: 25.0000 mg | ORAL_TABLET | Freq: Three times a day (TID) | ORAL | 0 refills | Status: AC | PRN

## 2022-10-21 MED ORDER — DIAZEPAM 5 MG/ML IJ SOLN
5.0000 mg | Freq: Once | INTRAMUSCULAR | Status: AC
  Administered 2022-10-21: 5 mg via INTRAVENOUS
  Filled 2022-10-21: qty 2

## 2022-10-21 MED ORDER — MECLIZINE HCL 25 MG PO TABS: 25.0000 mg | ORAL_TABLET | Freq: Three times a day (TID) | ORAL | 0 refills | Status: DC | PRN

## 2022-10-21 MED ORDER — SODIUM CHLORIDE 0.9 % IV BOLUS
1000.0000 mL | Freq: Once | INTRAVENOUS | Status: AC
  Administered 2022-10-21: 1000 mL via INTRAVENOUS

## 2022-10-21 MED ORDER — ONDANSETRON HCL 4 MG PO TABS: 4.0000 mg | ORAL_TABLET | Freq: Four times a day (QID) | ORAL | 0 refills | Status: AC

## 2022-10-21 MED ORDER — MECLIZINE HCL 25 MG PO TABS
25.0000 mg | ORAL_TABLET | Freq: Once | ORAL | Status: AC
  Administered 2022-10-21: 25 mg via ORAL
  Filled 2022-10-21: qty 1

## 2022-10-21 NOTE — ED Notes (Signed)
Called PT and asked to come see patient for eminent discharge.

## 2022-10-21 NOTE — Evaluation (Signed)
Physical Therapy Evaluation Patient Details Name: Veronica Hall MRN: [id number] DOB: 05/12/1985 Today's Date: 10/21/2022  History of Present Illness  Pt is 37 yo female presenting to ED on 10/21/22 with new onset dizziness with nausea.  Workup consistent with peripheral vertigo and PT asked to assess.  Pt with hx of HTN, scoliosis and fusion as child  Clinical Impression  Pt admitted with above diagnosis. At baseline, pt completely independent, active, and works as Pension scheme manager.  PT asked to see pt for vestibular workup.  Pt's workup was consistent with peripheral vertigo and she was found to have + hall pike dix on L indicative of L posterior canal BPPV.  Pt with some improvement after Epley manuevar but not completely resolved.  Reports feeling better than this morning.  Educated on safety, compensation techniques, self epley, and recommendation for f/u with outpt PT.  Pt providing teach back and reports will f/u with PCP and outpt therapy.  Pt expected to progress well.  From PT perspective, safe to d/c home with family but while hospitalized will continue to follow. Pt currently with functional limitations due to the deficits listed below (see PT Problem List). Pt will benefit from acute skilled PT to increase their independence and safety with mobility to allow discharge.           If plan is discharge home, recommend the following: A little help with walking and/or transfers;A little help with bathing/dressing/bathroom;Assistance with cooking/housework;Help with stairs or ramp for entrance;Assist for transportation   Can travel by private vehicle        Equipment Recommendations None recommended by PT  Recommendations for Other Services       Functional Status Assessment Patient has had a recent decline in their functional status and demonstrates the ability to make significant improvements in function in a reasonable and predictable amount of time.     Precautions /  Restrictions Precautions Precautions: None      Mobility  Bed Mobility Overal bed mobility: Needs Assistance Bed Mobility: Supine to Sit, Sit to Supine     Supine to sit: Supervision Sit to supine: Supervision   General bed mobility comments: Cued for focus points with transfers to assist with dizziness    Transfers Overall transfer level: Needs assistance Equipment used: None Transfers: Sit to/from Stand Sit to Stand: Supervision           General transfer comment: Close supervision for safety; cues for focus points    Ambulation/Gait Ambulation/Gait assistance: Contact guard assist, Supervision Gait Distance (Feet): 300 Feet Assistive device: None         General Gait Details: Pt ambulating with near normal gait speed but she reports feeling like she is going slower.  She did drift some with turning but no overt LOB.  Improved with cues to take turns slow, focus points, segmental turns. Did not need AD.  Progressed from CGA to supervision.  DId note posture of R shoulder higher and pt with slight decrease in stance time on L - pt does report hx of scolosis with fusion, likely cause of gait deviation  Stairs            Wheelchair Mobility     Tilt Bed    Modified Rankin (Stroke Patients Only)       Balance Overall balance assessment: Needs assistance   Sitting balance-Leahy Scale: Normal     Standing balance support: No upper extremity supported Standing balance-Leahy Scale: Good Standing balance comment: Can balance  and walk without UE support but does have obvious deficits from baseline                             Pertinent Vitals/Pain Pain Assessment Pain Assessment: No/denies pain    Home Living Family/patient expects to be discharged to:: Private residence Living Arrangements:  (family) Available Help at Discharge: Available PRN/intermittently;Family Type of Home: House Home Access: Stairs to enter Entrance Stairs-Rails:  None Entrance Stairs-Number of Steps: 5   Home Layout: One level Home Equipment: None      Prior Function Prior Level of Function : Independent/Modified Independent;Working/employed;Driving               ADLs Comments: Works as Visual merchandiser Upper Extremity Assessment: Overall WFL for tasks assessed RUE Deficits / Details: Did note pt with some shaking in right hand at times - reports she is feeling nervous, able to stop shaking    Lower Extremity Assessment Lower Extremity Assessment: Overall WFL for tasks assessed    Cervical / Trunk Assessment Cervical / Trunk Assessment: Other exceptions Cervical / Trunk Exceptions: Did note posture with R shoulder higher than L (reports hx scoliosis with fusion)  Communication      Cognition Arousal: Alert Behavior During Therapy: WFL for tasks assessed/performed Overall Cognitive Status: Within Functional Limits for tasks assessed                                          General Comments General comments (skin integrity, edema, etc.): VSS  Vestibular Workup:  -History: Pt awoke this morning and began having dizziness with mobility.  States feels like she is falling and everything is spinning.  No symptoms at rest and reports increases with movement lasting several seconds after she stops moving.  Reports occurs with walking, turning, transfers, and did state with rolling L in bed. Pt has had some nausea and diaphoresis with symptoms, but not lightheaded.  Noted no imaging has been completed as symptoms seemed consistent with peripheral vertigo.  Pt was given Antivert at 9:28 this morning.  Objective: BP stable with position changes. Spontaneous Nystagmus: Negative and still negative with lights off.  Gaze induced Nystagmus: Negative and negative with light off. EOEM/smooth pursuit: Negative, no symptoms Gaze Stabilization: Some initial difficulty  maintaining but with further instruction/practice was intact, no nystagmus, mild symptoms Head Thrust: Negative (but does not have continuous dizziness nor spontaneous nystagmus so not indicative of central cause) Head Shake: Negative for nystagmus Test of Skew: Negative [facility] R: Negative Hall Pike Dix L: Pt with immediate symptoms and closed eyes, potentially one beat of nystagmus witnessed but not present when she reopened eyes. Symptoms consistent with dizziness lasting ~10 seconds.  Performed Epley maneuvar for L sided BPPV.  Educated pt on all symptoms negative for central cause (ex Stroke).  Educated pt on BPPV and provided handout on BPPV, self Epley, and list of vestibular clinics for f/u outpatient.  Pt somewhat emotional - educated on BPPV, benign, and treatable.  Did recommend outpt PT followup.  Discussed safety including having supervision for mobility and not driving until symptoms completely resolve.  Pt verbalizing understanding. Also, educated on compensation techniques including slow transitions, segmental turns, taking her time, and focus points.     Exercises  Assessment/Plan    PT Assessment Patient needs continued PT services  PT Problem List Decreased knowledge of use of DME;Decreased balance;Decreased mobility;Decreased knowledge of precautions       PT Treatment Interventions DME instruction;Therapeutic exercise;Gait training;Balance training;Neuromuscular re-education;Functional mobility training;Canalith reposition;Therapeutic activities;Patient/family education;Stair training    PT Goals (Current goals can be found in the Care Plan section)  Acute Rehab PT Goals Patient Stated Goal: return home; get better and back to normal PT Goal Formulation: With patient Time For Goal Achievement: 11/04/22 Potential to Achieve Goals: Good    Frequency Min 1X/week     Co-evaluation               AM-PAC PT "6 Clicks" Mobility  Outcome Measure Help  needed turning from your back to your side while in a flat bed without using bedrails?: None Help needed moving from lying on your back to sitting on the side of a flat bed without using bedrails?: None Help needed moving to and from a bed to a chair (including a wheelchair)?: A Little Help needed standing up from a chair using your arms (e.g., wheelchair or bedside chair)?: A Little Help needed to walk in hospital room?: A Little Help needed climbing 3-5 steps with a railing? : A Little 6 Click Score: 20    End of Session Equipment Utilized During Treatment: Gait belt Activity Tolerance: Patient tolerated treatment well Patient left: in bed;with call bell/phone within reach Nurse Communication: Mobility status PT Visit Diagnosis: Dizziness and giddiness (R42);Other abnormalities of gait and mobility (R26.89)    Time: 1405-1510 PT Time Calculation (min) (ACUTE ONLY): 65 min   Charges:   PT Evaluation $PT Eval Low Complexity: 1 Low PT Treatments $Gait Training: 8-22 mins $Therapeutic Activity: 8-22 mins $Canalith Rep Proc: 8-22 mins PT General Charges $$ ACUTE PT VISIT: 1 Visit         Anise Salvo, PT Acute Rehab [facility] [phone]   Rayetta Humphrey 10/21/2022, 4:01 PM

## 2022-10-21 NOTE — ED Triage Notes (Signed)
Pt presents with dizziness, nausea and emesis, and sweats started this morning.

## 2022-10-21 NOTE — Discharge Instructions (Addendum)
Please do the exercises as instructed.  Call to schedule follow-up appointment with the physical therapist.  Take the medications as needed.  Return to the ER for worsening symptoms.

## 2022-10-21 NOTE — ED Provider Notes (Signed)
[place] EMERGENCY DEPARTMENT AT [facility] Provider Note   CSN: [id number] Arrival date & time: 10/21/22  4098     History  Chief Complaint  Patient presents with   Emesis   Nausea   Chills   Dizziness    Veronica Hall is a 37 y.o. female.  37 year old female with past medical history of hypertension presenting to the emergency department today with dizziness, nausea, chills, and vomiting.  The patient states that she woke up at 6 AM with this.  States that try to drink some orange juice because she thought that her blood sugar may be low but she had multiple episodes of vomiting afterwards.  She denies any headache.  Denies any focal weakness, numbness, or tingling.  The patient states that when she sits totally still that she feels okay but if she tries to move that her dizziness gets a lot worse.  She came to the emergency department today due to these ongoing symptoms.   Emesis Dizziness Associated symptoms: nausea and vomiting        Home Medications Prior to Admission medications   Medication Sig Start Date End Date Taking? Authorizing Provider  meclizine (ANTIVERT) 25 MG tablet Take 1 tablet (25 mg total) by mouth 3 (three) times daily as needed for dizziness. 10/21/22  Yes Durwin Glaze, MD  ondansetron (ZOFRAN) 4 MG tablet Take 1 tablet (4 mg total) by mouth every 6 (six) hours. 10/21/22  Yes Durwin Glaze, MD  amLODipine (NORVASC) 10 MG tablet Take 10 mg by mouth daily. 09/11/20   [provider]  Biotin 5000 MCG CAPS Take by mouth.    [provider]  cyclobenzaprine (FLEXERIL) 10 MG tablet Take 1 tablet (10 mg total) by mouth 2 (two) times daily as needed for up to 20 doses for muscle spasms. 11/27/20   Curatolo, Adam, DO  fluconazole (DIFLUCAN) 150 MG tablet Take 1 tablet (150 mg total) by mouth every other day. x 3, then take once a week for 3 months Patient not taking: Reported on 11/27/2020 04/21/13   Antionette Char, MD  LO  LOESTRIN FE 1 MG-10 MCG / 10 MCG tablet TAKE 1 TABLET BY MOUTH DAILY. Patient not taking: Reported on 11/27/2020 12/30/13   Antionette Char, MD  metroNIDAZOLE (FLAGYL) 500 MG tablet Take 1 tablet (500 mg total) by mouth 2 (two) times daily. Patient not taking: Reported on 11/27/2020 05/18/13   Antionette Char, MD  norelgestromin-ethinyl estradiol [employer]r [employer]ico) 150-35 MCG/24HR transdermal patch Place 1 patch onto the skin See admin instructions. 1 patch every week    [provider]  penicillin v potassium (VEETID) 500 MG tablet Take 1 tablet (500 mg total) by mouth every 6 (six) hours. Patient not taking: Reported on 11/27/2020 05/18/13   Antionette Char, MD      Allergies    Patient has no known allergies.    Review of Systems   Review of Systems  Gastrointestinal:  Positive for nausea and vomiting.  Neurological:  Positive for dizziness.  All other systems reviewed and are negative.   Physical Exam Updated Vital Signs BP (!) 135/92   Pulse 69   Temp 98.4 F (36.9 C)   Resp 15   Ht 5\' 3"  (1.6 m)   Wt 79.4 kg   SpO2 100%   BMI 31.00 kg/m  Physical Exam Vitals and nursing note reviewed.   Gen: NAD Eyes: PERRL, EOMI, no appreciable nystagmus noted HEENT: no oropharyngeal swelling Neck: trachea midline  Resp: clear to auscultation bilaterally Card: RRR, no murmurs, rubs, or gallops Abd: nontender, nondistended Extremities: no calf tenderness, no edema Vascular: 2+ radial pulses bilaterally, 2+ DP pulses bilaterally Neuro: Cranial nerves intact, equal strength and sensation throughout bilateral upper and lower extremities with no dysmetria on finger-to-nose testing Skin: no rashes Psyc: acting appropriately   ED Results / Procedures / Treatments   Labs (all labs ordered are listed, but only abnormal results are displayed) Labs Reviewed  COMPREHENSIVE METABOLIC PANEL - Abnormal; Notable for the following components:      Result Value   Potassium 3.3 (*)     Glucose, Bld 101 (*)    Total Protein 8.8 (*)    All other components within normal limits  URINALYSIS, ROUTINE W REFLEX MICROSCOPIC - Abnormal; Notable for the following components:   Color, Urine STRAW (*)    Ketones, ur 20 (*)    All other components within normal limits  CBG MONITORING, ED - Abnormal; Notable for the following components:   Glucose-Capillary 101 (*)    All other components within normal limits  RESP PANEL BY RT-PCR (RSV, FLU A&B, COVID)  [id number]  LIPASE, BLOOD  CBC  HCG, SERUM, QUALITATIVE    EKG None  Radiology No results found.  Procedures Procedures    Medications Ordered in ED Medications  sodium chloride 0.9 % bolus 1,000 mL (0 mLs Intravenous Stopped 10/21/22 1307)  meclizine (ANTIVERT) tablet 25 mg (25 mg Oral Given 10/21/22 0928)  ondansetron (ZOFRAN) injection 4 mg (4 mg Intravenous Given 10/21/22 0927)  diazepam (VALIUM) injection 5 mg (5 mg Intravenous Given 10/21/22 1049)    ED Course/ Medical Decision Making/ A&P                                 Medical Decision Making 37 year old female with past medical history of hypertension presenting to the emergency department today with dizziness.  Based on description of her symptoms and reassuring neurovascular exam suspicion for CVA is low at this time.  This seems more consistent with peripheral vertigo although I do not appreciate any nystagmus here on exam.  I will treat the patient symptomatically and obtain basic labs to evaluate for electrolyte abnormalities here.  Will give her meclizine and Zofran as well as IV fluids and reevaluate.  If she has persistent symptoms I will consider further workup for central cause although suspicion for this is relatively low at this time given her age, lack of risk factors, and reassuring exam.  The patient's workup is reassuring here.  I did have physical therapy come down and work with the patient.  They did some vestibular therapy on the patient she was  feeling better.  I think she is stable for discharge.  They have given the patient resources for vestibular therapy at home as well as outpatient resources for further management.  She will be discharged with return precautions.  Amount and/or Complexity of Data Reviewed Labs: ordered.  Risk Prescription drug management.           Final Clinical Impression(s) / ED Diagnoses Final diagnoses:  Vertigo  Dispo: discharge  Rx / DC Orders ED Discharge Orders          Ordered    meclizine (ANTIVERT) 25 MG tablet  3 times daily PRN        10/21/22 1507    ondansetron (ZOFRAN) 4 MG tablet  Every 6 hours  10/21/22 1507              Durwin Glaze, MD 10/21/22 [phone]
